# Patient Record
Sex: Female | Born: 1940 | Race: White | Hispanic: No | Marital: Single | Smoking: Former smoker
Health system: Southern US, Community
[De-identification: ages and names within clinical notes are randomized; demographics above are authoritative.]

## PROBLEM LIST (undated history)

## (undated) DIAGNOSIS — K5792 Diverticulitis of intestine, part unspecified, without perforation or abscess without bleeding: Secondary | ICD-10-CM

## (undated) DIAGNOSIS — R053 Chronic cough: Secondary | ICD-10-CM

## (undated) DIAGNOSIS — R42 Dizziness and giddiness: Secondary | ICD-10-CM

## (undated) DIAGNOSIS — I10 Essential (primary) hypertension: Secondary | ICD-10-CM

## (undated) DIAGNOSIS — G4700 Insomnia, unspecified: Secondary | ICD-10-CM

## (undated) DIAGNOSIS — R05 Cough: Secondary | ICD-10-CM

## (undated) DIAGNOSIS — F329 Major depressive disorder, single episode, unspecified: Secondary | ICD-10-CM

## (undated) DIAGNOSIS — J101 Influenza due to other identified influenza virus with other respiratory manifestations: Secondary | ICD-10-CM

## (undated) HISTORY — DX: Insomnia, unspecified: G47.00

## (undated) HISTORY — PX: COLONOSCOPY: SHX174

## (undated) HISTORY — PX: EYE SURGERY: SHX253

## (undated) HISTORY — DX: Essential (primary) hypertension: I10

## (undated) HISTORY — DX: Major depressive disorder, single episode, unspecified: F32.9

---

## 2012-03-11 ENCOUNTER — Emergency Department (HOSPITAL_BASED_OUTPATIENT_CLINIC_OR_DEPARTMENT_OTHER)
Admission: EM | Admit: 2012-03-11 | Discharge: 2012-03-11 | Disposition: A | Discharge status: Home / Self Care | Payer: 59 | Attending: Emergency Medicine | Admitting: Emergency Medicine

## 2012-03-11 ENCOUNTER — Emergency Department (HOSPITAL_BASED_OUTPATIENT_CLINIC_OR_DEPARTMENT_OTHER): Payer: 59

## 2012-03-11 ENCOUNTER — Encounter (HOSPITAL_BASED_OUTPATIENT_CLINIC_OR_DEPARTMENT_OTHER): Payer: Self-pay

## 2012-03-11 DIAGNOSIS — R1110 Vomiting, unspecified: Secondary | ICD-10-CM

## 2012-03-11 DIAGNOSIS — R197 Diarrhea, unspecified: Secondary | ICD-10-CM

## 2012-03-11 DIAGNOSIS — R109 Unspecified abdominal pain: Secondary | ICD-10-CM

## 2012-03-11 DIAGNOSIS — Z8719 Personal history of other diseases of the digestive system: Secondary | ICD-10-CM | POA: Insufficient documentation

## 2012-03-11 DIAGNOSIS — R42 Dizziness and giddiness: Secondary | ICD-10-CM | POA: Insufficient documentation

## 2012-03-11 HISTORY — DX: Diverticulitis of intestine, part unspecified, without perforation or abscess without bleeding: K57.92

## 2012-03-11 HISTORY — DX: Dizziness and giddiness: R42

## 2012-03-11 LAB — CBC WITH DIFFERENTIAL/PLATELET
Basophils Absolute: 0 K/uL (ref 0.0–0.1)
Basophils Relative: 0 % (ref 0–1)
Eosinophils Absolute: 0.2 K/uL (ref 0.0–0.7)
Eosinophils Relative: 3 % (ref 0–5)
HCT: 39.6 % (ref 36.0–46.0)
Hemoglobin: 13.9 g/dL (ref 12.0–15.0)
Lymphocytes Relative: 22 % (ref 12–46)
Lymphs Abs: 1.4 K/uL (ref 0.7–4.0)
MCH: 31 pg (ref 26.0–34.0)
MCHC: 35.1 g/dL (ref 30.0–36.0)
MCV: 88.4 fL (ref 78.0–100.0)
Monocytes Absolute: 0.5 K/uL (ref 0.1–1.0)
Monocytes Relative: 9 % (ref 3–12)
Neutro Abs: 4.1 K/uL (ref 1.7–7.7)
Neutrophils Relative %: 66 % (ref 43–77)
Platelets: 251 K/uL (ref 150–400)
RBC: 4.48 MIL/uL (ref 3.87–5.11)
RDW: 12.4 % (ref 11.5–15.5)
WBC: 6.2 K/uL (ref 4.0–10.5)

## 2012-03-11 LAB — URINALYSIS, ROUTINE W REFLEX MICROSCOPIC
Bilirubin Urine: NEGATIVE
Ketones, ur: NEGATIVE mg/dL
Leukocytes, UA: NEGATIVE
Nitrite: NEGATIVE
Specific Gravity, Urine: 1.016 (ref 1.005–1.030)
Urobilinogen, UA: 0.2 mg/dL (ref 0.0–1.0)

## 2012-03-11 LAB — COMPREHENSIVE METABOLIC PANEL
Albumin: 3.8 g/dL (ref 3.5–5.2)
Alkaline Phosphatase: 71 U/L (ref 39–117)
BUN: 12 mg/dL (ref 6–23)
Creatinine, Ser: 0.8 mg/dL (ref 0.50–1.10)
GFR calc Af Amer: 85 mL/min — ABNORMAL LOW (ref >90)
Glucose, Bld: 103 mg/dL — ABNORMAL HIGH (ref 70–99)
Potassium: 4.1 mEq/L (ref 3.5–5.1)
Total Bilirubin: 0.4 mg/dL (ref 0.3–1.2)
Total Protein: 7.2 g/dL (ref 6.0–8.3)

## 2012-03-11 LAB — LIPASE, BLOOD: Lipase: 27 U/L (ref 11–59)

## 2012-03-11 MED ORDER — HYDROCODONE-ACETAMINOPHEN 5-325 MG PO TABS: 2.0000 | ORAL_TABLET | ORAL | Status: DC | PRN

## 2012-03-11 MED ORDER — IOHEXOL 300 MG/ML  SOLN: 25.0000 mL | INTRAMUSCULAR | Status: AC

## 2012-03-11 MED ORDER — MORPHINE SULFATE 4 MG/ML IJ SOLN
4.0000 mg | Freq: Once | INTRAMUSCULAR | Status: AC
  Administered 2012-03-11: 4 mg via INTRAVENOUS
  Filled 2012-03-11: qty 1

## 2012-03-11 MED ORDER — ONDANSETRON 4 MG PO TBDP: 4.0000 mg | ORAL_TABLET | Freq: Three times a day (TID) | ORAL | Status: DC | PRN

## 2012-03-11 MED ORDER — ONDANSETRON HCL 4 MG/2ML IJ SOLN
4.0000 mg | Freq: Once | INTRAMUSCULAR | Status: AC
  Administered 2012-03-11: 4 mg via INTRAVENOUS
  Filled 2012-03-11: qty 2

## 2012-03-11 MED ORDER — IOHEXOL 300 MG/ML  SOLN
100.0000 mL | Freq: Once | INTRAMUSCULAR | Status: AC | PRN
  Administered 2012-03-11: 100 mL via INTRAVENOUS

## 2012-03-11 MED ORDER — SODIUM CHLORIDE 0.9 % IV SOLN
Freq: Once | INTRAVENOUS | Status: AC
  Administered 2012-03-11: 13:00:00 via INTRAVENOUS

## 2012-03-11 NOTE — ED Provider Notes (Signed)
History     CSN: [id number]  Arrival date & time 03/11/12  1213   First MD Initiated Contact with Patient 03/11/12 1226      Chief Complaint  Patient presents with  . Abdominal Pain    (Consider location/radiation/quality/duration/timing/severity/associated sxs/prior treatment) HPI Comments: Pt states that it feels the same as previous diverticulitis bouts  Patient is a 71 y.o. female presenting with abdominal pain. The history is provided by the patient. No language interpreter was used.  Abdominal Pain The primary symptoms of the illness include abdominal pain, nausea and diarrhea. The primary symptoms of the illness do not include fever, vomiting, dysuria, vaginal discharge or vaginal bleeding. The current episode started more than 2 days ago. The onset of the illness was gradual. The problem has not changed since onset. Significant associated medical issues include diverticulitis.    Past Medical History  Diagnosis Date  . Vertigo   . Diverticulitis     Past Surgical History  Procedure Date  . Eye surgery     No family history on file.  History  Substance Use Topics  . Smoking status: Never Smoker   . Smokeless tobacco: Not on file  . Alcohol Use: No    OB History    Grav Para Term Preterm Abortions TAB SAB Ect Mult Living                  Review of Systems  Constitutional: Negative for fever.  Respiratory: Negative.   Cardiovascular: Negative.   Gastrointestinal: Positive for nausea, abdominal pain and diarrhea. Negative for vomiting.  Genitourinary: Negative for dysuria, vaginal bleeding and vaginal discharge.    Allergies  Review of patient's allergies indicates no known allergies.  Home Medications   Current Outpatient Rx  Name  Route  Sig  Dispense  Refill  . CELEXA PO   Oral   Take by mouth.         . MECLIZINE HCL PO   Oral   Take by mouth.         . TRAZODONE HCL PO   Oral   Take by mouth.           BP 154/78  Pulse 72   Temp 97.3 F (36.3 C) (Oral)  Resp 16  Ht 5\' 4"  (1.626 m)  Wt 167 lb (75.751 kg)  BMI 28.67 kg/m2  SpO2 94%  Physical Exam  Nursing note and vitals reviewed. Constitutional: She is oriented to person, place, and time. She appears well-developed and well-nourished.  HENT:  Head: Normocephalic and atraumatic.  Eyes: Conjunctivae normal are normal. Pupils are equal, round, and reactive to light.  Neck: Neck supple.  Cardiovascular: Normal rate and regular rhythm.   Pulmonary/Chest: Effort normal and breath sounds normal.  Abdominal: Soft. Bowel sounds are normal.       Generalized lower abdominal tenderness  Musculoskeletal: Normal range of motion.  Neurological: She is alert and oriented to person, place, and time.  Skin: Skin is warm and dry.  Psychiatric: She has a normal mood and affect.    ED Course  Procedures (including critical care time)  Labs Reviewed  COMPREHENSIVE METABOLIC PANEL - Abnormal; Notable for the following:    Glucose, Bld 103 (*)     GFR calc non Af Amer 74 (*)     GFR calc Af Amer 85 (*)     All other components within normal limits  CBC WITH DIFFERENTIAL  LIPASE, BLOOD  URINALYSIS, ROUTINE W REFLEX MICROSCOPIC  Ct Abdomen Pelvis W Contrast  03/11/2012  *RADIOLOGY REPORT*  Clinical Data: Abdominal pain, nausea/vomiting, diarrhea  CT ABDOMEN AND PELVIS WITH CONTRAST  Technique:  Multidetector CT imaging of the abdomen and pelvis was performed following the standard protocol during bolus administration of intravenous contrast.  Contrast: OMNIPAQUE IOHEXOL 300 MG/ML  SOLN  Comparison: None.  Findings: Lung bases are essentially clear.  Liver, spleen, and adrenal glands are within normal limits.  Pancreas is notable for suspected pancreas to be some but is otherwise unremarkable.  Gallbladder is unremarkable.  No intrahepatic or extrahepatic ductal dilatation.  Kidneys are within normal limits.  No hydronephrosis.  No evidence of bowel obstruction.   Normal appendix.  Extensive colonic diverticulosis, without associated inflammatory changes.  Atherosclerotic calcifications of the abdominal aorta and branch vessels.  No abdominopelvic ascites.  No suspicious abdominopelvic lymphadenopathy.  Uterus and bilateral ovaries are unremarkable.  Bladder is within normal limits.  Degenerative changes of the visualized thoracolumbar spine.  IMPRESSION: No evidence of bowel obstruction.  Normal appendix.  Extensive colonic diverticulosis, without associated inflammatory changes.  No CT findings to account for the patient's abdominal pain.   Original Report Authenticated By: Charline Bills, M.D.      1. Abdominal pain   2. Vomiting   3. Diarrhea       MDM  Pt tolerating po:no sign of diverticulitis on ZO:[id number] likely viral:will send home with something for nausea and vicodin per pt request        Teressa Lower, NP 03/11/12 1438

## 2012-03-11 NOTE — ED Notes (Signed)
Pt without V/D while in ED

## 2012-03-11 NOTE — ED Notes (Signed)
C/o abd pain x 1 week-n/v/d started yesterday

## 2012-03-11 NOTE — ED Provider Notes (Signed)
Medical screening examination/treatment/procedure(s) were performed by non-physician practitioner and as supervising physician I was immediately available for consultation/collaboration.   Rolan Bucco, MD 03/11/12 1450

## 2016-02-02 ENCOUNTER — Ambulatory Visit (INDEPENDENT_AMBULATORY_CARE_PROVIDER_SITE_OTHER): Payer: Medicare HMO | Admitting: Family Medicine

## 2016-02-02 ENCOUNTER — Encounter (INDEPENDENT_AMBULATORY_CARE_PROVIDER_SITE_OTHER): Payer: Self-pay | Admitting: Family

## 2016-02-02 VITALS — BP 164/74 | HR 79 | Temp 97.9°F | Resp 18

## 2016-02-02 DIAGNOSIS — J209 Acute bronchitis, unspecified: Secondary | ICD-10-CM

## 2016-02-02 MED ORDER — FLUTICASONE PROPIONATE HFA 220 MCG/ACT IN AERO
2.0000 | INHALATION_SPRAY | Freq: Two times a day (BID) | RESPIRATORY_TRACT | 0 refills | Status: AC
Start: 2016-02-02 — End: ?

## 2016-02-02 MED ORDER — ALBUTEROL SULFATE HFA 108 (90 BASE) MCG/ACT IN AERS
2.0000 | INHALATION_SPRAY | Freq: Four times a day (QID) | RESPIRATORY_TRACT | 0 refills | Status: AC | PRN
Start: 2016-02-02 — End: ?

## 2016-02-02 MED ORDER — DOXYCYCLINE MONOHYDRATE 100 MG OR CAPS
100.0000 mg | ORAL_CAPSULE | Freq: Two times a day (BID) | ORAL | 0 refills | Status: AC
Start: 2016-02-02 — End: 2016-02-12

## 2016-02-02 NOTE — Progress Notes (Signed)
[facility]        SUBJECTIVE:  Margaret Bruce is an 75 year old female who presents with   Chief Complaint   Patient presents with   . Cough     wheezing, tired X 1 week       Onset:  1 week  Symptom/s:  Started with cold symptoms and feeling tired. Then she gradually developed worsening cough that is productive. She reports occasionally feeling short of breath and wheezy, but no fever or chills noted.  No chest pain, chest tightness note   no sick contacts.  Treatment tried: none        Review of Systems   Constitutional: Negative for chills and fever.   HENT: Negative for postnasal drip and sinus pain.    Cardiovascular: Negative for palpitations and leg swelling.   Gastrointestinal: Negative for nausea and vomiting.   Neurological: Negative for headaches.       I have  personally reviewed the past medical history and social history with the patient.        Review of patient's allergies indicates:  No Known Allergies    OBJECTIVE:  BP 164/74   Pulse 79   Temp 97.9 F (36.6 C) (Temporal)   Resp 18   SpO2 93%     Physical Exam   Constitutional: She appears well-developed and well-nourished. No distress.   HENT:   Head: Normocephalic and atraumatic.   Right Ear: External ear normal.   Left Ear: External ear normal.   Nose: Nose normal.   Mouth/Throat: Oropharynx is clear and moist. No oropharyngeal exudate.   Neck: Normal range of motion. Neck supple.   Cardiovascular: Normal rate, regular rhythm and normal heart sounds.    Pulmonary/Chest: Effort normal. She has no wheezes. She has rales (more of crackles than rales).   Lymphadenopathy:     She has no cervical adenopathy.       Assessment/Plan:    1. Acute bronchitis, unspecified organism  - antibiotic started based on patient's age and noted crackles on examination.  - Albuterol Sulfate HFA 108 (90 Base) MCG/ACT Inhalation Aero Soln; Inhale 2 puffs by mouth every 6 hours as needed for shortness of breath/wheezing.  Dispense: 1  Inhaler; Refill: 0  - Fluticasone Propionate HFA 220 MCG/ACT Inhalation Aerosol; Inhale 2 puffs by mouth 2 times a day.  Dispense: 1 Inhaler; Refill: 0  - Doxycycline Monohydrate 100 MG Oral Cap; Take 1 capsule (100 mg) by mouth 2 times a day for 10 days. Take until gone.  Dispense: 20 capsule; Refill: 0  - she agrees to establish to care with primary care provider here.    Patient expressed understanding of treatment options discussed, including benefits and risks, questions encouraged and all answered.

## 2016-02-02 NOTE — Patient Instructions (Signed)
Bronchitis, Antibiotic Treatment (Adult)    Bronchitis is an infection of the air passages (bronchial tubes) in your lungs. It often occurs when you have a cold. This illness is contagious during the first few days and is spread through the air by coughing and sneezing, or by direct contact (touching the sick person and then touching your own eyes, nose, or mouth).  Symptoms of bronchitis include cough with mucus (phlegm) and low-grade fever. Bronchitis usually lasts 7 to 14 days. Mild cases can be treated with simple home remedies. More severe infection is treated with an antibiotic.  Home care  Follow these guidelines when caring for yourself at home:   If your symptoms are severe, rest at home for the first 2 to 3 days. When you go back to your usual activities, don't let yourself get too tired.   Do not smoke. Also avoid being exposed to secondhand smoke.   You may use over-the-counter medicines to control fever or pain, unless another medicine was prescribed. If you have chronic liver or kidney disease or have ever had a stomach ulcer or gastrointestinal bleeding, talk with your healthcare provider before using these medicines. Also talk to your provider if you are taking medicine to prevent blood clots. Aspirin should never be given to anyone younger than 73 years of age who is ill with a viral infection or fever. It may cause severe liver or brain damage.   Your appetite may be poor, so a light diet is fine. Avoid dehydration by drinking 6 to 8 glasses of fluids per day (such as water, soft drinks, sports drinks, juices, tea, or soup). Extra fluids will help loosen secretions in the nose and lungs.   Over-the-counter cough, cold, and sore-throat medicines will not shorten the length of the illness, but they may be helpful to reduce symptoms. (Note: Do not use decongestants if you have high blood pressure.)   Finish all antibiotic medicine. Do this even if you are feeling better after only a few days.   Follow-up care  Follow up with your healthcare provider, or as advised. If you had an X-ray or ECG (electrocardiogram), a specialist will review it. You will be notified of any new findings that may affect your care.  If you are age 65 or older, or if you have a chronic lung disease or condition that affects your immune system, or you smoke, askyour healthcare provider about getting apneumococcal vaccine and a yearly flu shot (influenza vaccine).  When to seek medical advice  Call your healthcare provider right away if any of these occur:   Fever of 100.4F (38C) or higher, or as directed by your healthcare provider   Coughing up increased amounts of colored sputum   Weakness, drowsiness, headache, facial pain, ear pain, or a stiff neck  Call 911  Call 911 if any of these occur.   Coughing up blood   Worsening weakness, drowsiness, headache, or stiff neck   Trouble breathing, wheezing, or pain with breathing  Date Last Reviewed: 12/26/2013   2000-2017 [employer] [address], [place] [address]. All rights reserved. This information is not intended as a substitute for professional medical care. Always follow your healthcare professional's instructions.

## 2016-04-15 ENCOUNTER — Inpatient Hospital Stay (HOSPITAL_BASED_OUTPATIENT_CLINIC_OR_DEPARTMENT_OTHER)
Admission: EM | Admit: 2016-04-15 | Discharge: 2016-04-19 | DRG: 152 | Disposition: A | Discharge status: Home / Self Care | Payer: Medicare HMO | Attending: Internal Medicine | Admitting: Internal Medicine

## 2016-04-15 ENCOUNTER — Emergency Department (HOSPITAL_BASED_OUTPATIENT_CLINIC_OR_DEPARTMENT_OTHER): Payer: Medicare HMO

## 2016-04-15 ENCOUNTER — Encounter (HOSPITAL_BASED_OUTPATIENT_CLINIC_OR_DEPARTMENT_OTHER): Payer: Self-pay | Admitting: *Deleted

## 2016-04-15 DIAGNOSIS — J111 Influenza due to unidentified influenza virus with other respiratory manifestations: Principal | ICD-10-CM | POA: Diagnosis present

## 2016-04-15 DIAGNOSIS — R197 Diarrhea, unspecified: Secondary | ICD-10-CM

## 2016-04-15 DIAGNOSIS — Z87891 Personal history of nicotine dependence: Secondary | ICD-10-CM

## 2016-04-15 DIAGNOSIS — R112 Nausea with vomiting, unspecified: Secondary | ICD-10-CM

## 2016-04-15 DIAGNOSIS — J441 Chronic obstructive pulmonary disease with (acute) exacerbation: Secondary | ICD-10-CM | POA: Diagnosis not present

## 2016-04-15 DIAGNOSIS — F4310 Post-traumatic stress disorder, unspecified: Secondary | ICD-10-CM | POA: Diagnosis present

## 2016-04-15 DIAGNOSIS — Z79899 Other long term (current) drug therapy: Secondary | ICD-10-CM

## 2016-04-15 DIAGNOSIS — J9601 Acute respiratory failure with hypoxia: Secondary | ICD-10-CM | POA: Diagnosis present

## 2016-04-15 DIAGNOSIS — M6281 Muscle weakness (generalized): Secondary | ICD-10-CM

## 2016-04-15 DIAGNOSIS — R0902 Hypoxemia: Secondary | ICD-10-CM | POA: Diagnosis present

## 2016-04-15 DIAGNOSIS — E876 Hypokalemia: Secondary | ICD-10-CM | POA: Diagnosis present

## 2016-04-15 LAB — COMPREHENSIVE METABOLIC PANEL
ALBUMIN: 3.9 g/dL (ref 3.5–5.0)
ALT: 25 U/L (ref 14–54)
AST: 31 U/L (ref 15–41)
Alkaline Phosphatase: 59 U/L (ref 38–126)
Anion gap: 11 (ref 5–15)
BILIRUBIN TOTAL: 0.5 mg/dL (ref 0.3–1.2)
BUN: 10 mg/dL (ref 6–20)
CO2: 23 mmol/L (ref 22–32)
Calcium: 8.9 mg/dL (ref 8.9–10.3)
Chloride: 99 mmol/L — ABNORMAL LOW (ref 101–111)
Creatinine, Ser: 0.73 mg/dL (ref 0.44–1.00)
GFR calc Af Amer: >60 mL/min (ref >60)
GFR calc non Af Amer: >60 mL/min (ref >60)
GLUCOSE: 93 mg/dL (ref 65–99)
POTASSIUM: 3.4 mmol/L — AB (ref 3.5–5.1)
Sodium: 133 mmol/L — ABNORMAL LOW (ref 135–145)
TOTAL PROTEIN: 7 g/dL (ref 6.5–8.1)

## 2016-04-15 LAB — URINALYSIS, MICROSCOPIC (REFLEX)

## 2016-04-15 LAB — CBC WITH DIFFERENTIAL/PLATELET
BASOS PCT: 0 %
Basophils Absolute: 0 10*3/uL (ref 0.0–0.1)
Eosinophils Absolute: 0 10*3/uL (ref 0.0–0.7)
Eosinophils Relative: 0 %
HEMATOCRIT: 39.8 % (ref 36.0–46.0)
Hemoglobin: 13.2 g/dL (ref 12.0–15.0)
Lymphocytes Relative: 30 %
Lymphs Abs: 2 10*3/uL (ref 0.7–4.0)
MCH: 30.3 pg (ref 26.0–34.0)
MCHC: 33.2 g/dL (ref 30.0–36.0)
MCV: 91.3 fL (ref 78.0–100.0)
Monocytes Absolute: 0.8 10*3/uL (ref 0.1–1.0)
Monocytes Relative: 12 %
NEUTROS ABS: 3.7 10*3/uL (ref 1.7–7.7)
NEUTROS PCT: 58 %
Platelets: 178 10*3/uL (ref 150–400)
RBC: 4.36 MIL/uL (ref 3.87–5.11)
RDW: 12.7 % (ref 11.5–15.5)
WBC: 6.5 10*3/uL (ref 4.0–10.5)

## 2016-04-15 LAB — URINALYSIS, ROUTINE W REFLEX MICROSCOPIC
Bilirubin Urine: NEGATIVE
GLUCOSE, UA: NEGATIVE mg/dL
KETONES UR: 15 mg/dL — AB
Nitrite: NEGATIVE
PH: 6 (ref 5.0–8.0)
Protein, ur: NEGATIVE mg/dL
SPECIFIC GRAVITY, URINE: 1.018 (ref 1.005–1.030)

## 2016-04-15 LAB — TROPONIN I: Troponin I: <0.03 ng/mL (ref <0.03)

## 2016-04-15 LAB — LIPASE, BLOOD: Lipase: 24 U/L (ref 11–51)

## 2016-04-15 MED ORDER — SODIUM CHLORIDE 0.9 % IV BOLUS (SEPSIS)
1000.0000 mL | Freq: Once | INTRAVENOUS | Status: AC
  Administered 2016-04-15: 1000 mL via INTRAVENOUS

## 2016-04-15 MED ORDER — PREDNISONE 50 MG PO TABS
60.0000 mg | ORAL_TABLET | Freq: Once | ORAL | Status: AC
  Administered 2016-04-15: 60 mg via ORAL
  Filled 2016-04-15: qty 1

## 2016-04-15 MED ORDER — ONDANSETRON HCL 4 MG/2ML IJ SOLN
4.0000 mg | Freq: Once | INTRAMUSCULAR | Status: AC
  Administered 2016-04-15: 4 mg via INTRAVENOUS
  Filled 2016-04-15: qty 2

## 2016-04-15 MED ORDER — IBUPROFEN 400 MG PO TABS
600.0000 mg | ORAL_TABLET | Freq: Once | ORAL | Status: AC
  Administered 2016-04-15: 600 mg via ORAL
  Filled 2016-04-15: qty 1

## 2016-04-15 MED ORDER — IPRATROPIUM-ALBUTEROL 0.5-2.5 (3) MG/3ML IN SOLN
3.0000 mL | Freq: Four times a day (QID) | RESPIRATORY_TRACT | Status: DC
  Administered 2016-04-15: 3 mL via RESPIRATORY_TRACT
  Filled 2016-04-15 (×2): qty 3

## 2016-04-15 MED ORDER — IPRATROPIUM-ALBUTEROL 0.5-2.5 (3) MG/3ML IN SOLN
3.0000 mL | RESPIRATORY_TRACT | Status: AC
  Administered 2016-04-15 (×2): 3 mL via RESPIRATORY_TRACT
  Filled 2016-04-15 (×2): qty 3

## 2016-04-15 MED ORDER — DOXYCYCLINE HYCLATE 100 MG PO TABS
100.0000 mg | ORAL_TABLET | Freq: Once | ORAL | Status: AC
  Administered 2016-04-15: 100 mg via ORAL
  Filled 2016-04-15: qty 1

## 2016-04-15 NOTE — ED Notes (Signed)
Chronic Bronchitis 2 months.  Pt had worsening in her condition 3 days ago with strong wet sounding cough, unable to produce phlegm. Pt also reports body aches, Pt also has a upset stomach (from the phlegm?) and has had post tussive emesis.  Pt states that she has a headache and "every bone" in her body hurts.  Pt also has seen a MD in [place] and has been taking inhalers without any relief.  Pt is having strong coughing spells and states that she is feeling sick and miserable.

## 2016-04-15 NOTE — ED Triage Notes (Signed)
Pt c/o n/v/d  And cough x 2 days

## 2016-04-15 NOTE — Progress Notes (Signed)
Transfer [facility]. Discussed with Dr. Verdie MosherLiu. Ms. Nichole Fiscalnman is a 76 yo female with pmh of COPD; who presents with 3 days of increased cough and SOB. CXR clear. Lab work relatively unremarkable except for potassium 3.4. Suspected to have COPD exacerbation. Given prednisone 60mg  po, 2 DuoNebs, antiemetics, 1  L NS IVF, and doxycycline. Transferring [facility] under observation status.

## 2016-04-15 NOTE — ED Notes (Signed)
RN starting a line, so delay in EKG.

## 2016-04-15 NOTE — ED Notes (Signed)
Pt ambulated to restroom on room air. HR 108, SATS 82%. Placed pt on 2LT [place] O2 tank in restroom ,SATS returned to 94%. Returned pt to room.

## 2016-04-15 NOTE — ED Provider Notes (Signed)
[facility] Provider Note   CSN: [id number] Arrival date & time: 04/15/16  1629 By signing my name below, I, Levon Hedger, attest that this documentation has been prepared under the direction and in the presence of Lavera Guise, MD . Electronically Signed: Levon Hedger, Scribe. 04/15/2016. 9:47 PM   History   Chief Complaint Chief Complaint  Patient presents with  . Emesis  . Cough   HPI Nichole Hunt is a 76 y.o. female with a reported hx of COPD who presents to the Emergency Department complaining of acute on chronic persistent, nonproductive cough which began three days ago.  She reports increased cough x 3 days. She notes associated generalized body aches, chills, generalized abdominal pain, post tussive emesis, diarrhea, chest discomfort, SOB, dizziness, and wheezing. She describes her chest pain as throbbing and states it is exacerbated by vomiting, coughing and moving. Pt was placed on abx and steroids in 10/17 for chronic bronchitis flare. Pt states she had improved at the end of last month, but her condition worsened three days ago. She moved back from [place] on 03/17/16 and denies any leg pain or swelling since this time. She reports decreased PO intake. No hx of heart problems. Pt denies any dysuria or urinary frequency.   The history is provided by the patient. No language interpreter was used.   Past Medical History:  Diagnosis Date  . Diverticulitis   . Vertigo     Patient Active Problem List   Diagnosis Date Noted  . COPD exacerbation (HCC) 04/15/2016    Past Surgical History:  Procedure Laterality Date  . EYE SURGERY      OB History    No data available      Home Medications    Prior to Admission medications   Medication Sig Start Date End Date Taking? Authorizing Provider  albuterol (PROVENTIL HFA;VENTOLIN HFA) 108 (90 Base) MCG/ACT inhaler Inhale into the lungs every 6 (six) hours as needed for wheezing or shortness of breath.   Yes  Historical Provider, MD  Citalopram Hydrobromide (CELEXA PO) Take by mouth.    Historical Provider, MD  HYDROcodone-acetaminophen (NORCO/VICODIN) 5-325 MG per tablet Take 2 tablets by mouth every 4 (four) hours as needed for pain. 03/11/12   Teressa Lower, NP  MECLIZINE HCL PO Take by mouth.    Historical Provider, MD  ondansetron (ZOFRAN ODT) 4 MG disintegrating tablet Take 1 tablet (4 mg total) by mouth every 8 (eight) hours as needed for nausea. 03/11/12   Teressa Lower, NP  TRAZODONE HCL PO Take by mouth.    Historical Provider, MD    Family History History reviewed. No pertinent family history.  Social History Social History  Substance Use Topics  . Smoking status: Never Smoker  . Smokeless tobacco: Not on file  . Alcohol use No    Allergies   Patient has no known allergies.  Review of Systems Review of Systems 10 systems reviewed and all are negative for acute change except as noted in the HPI.   Physical Exam Updated Vital Signs BP 140/68   Pulse 98   Temp 100.8 F (38.2 C) (Oral)   Resp 22   Ht 5\' 4"  (1.626 m)   Wt 170 lb (77.1 kg)   SpO2 90%   BMI 29.18 kg/m   Physical Exam Physical Exam  Nursing note and vitals reviewed. Constitutional: non-toxic, and in no acute distress Head: Normocephalic and atraumatic.  Mouth/Throat: Oropharynx is clear and moist.  Neck: Normal range of  motion. Neck supple.  Cardiovascular: Normal rate and regular rhythm.   Pulmonary/Chest: Effort normal scattered expiratory wheezing with Bronchospastic cough. No conversational dyspnea.   Abdominal: Soft. There is no tenderness. There is no rebound and no guarding.  Musculoskeletal: Normal range of motion. No calf tenderness or lower extremity edema Neurological: Alert, no facial droop, fluent speech, moves all extremities symmetrically Skin: Skin is warm and dry.  Psychiatric: Cooperative   ED Treatments / Results  DIAGNOSTIC STUDIES:  Oxygen Saturation is 97% on RA,  normal by my interpretation.    COORDINATION OF CARE:  6:55 PM Discussed treatment plan with pt at bedside and pt agreed to plan.   Labs (all labs ordered are listed, but only abnormal results are displayed) Labs Reviewed  COMPREHENSIVE METABOLIC PANEL - Abnormal; Notable for the following:       Result Value   Sodium 133 (*)    Potassium 3.4 (*)    Chloride 99 (*)    All other components within normal limits  URINALYSIS, ROUTINE W REFLEX MICROSCOPIC - Abnormal; Notable for the following:    Hgb urine dipstick TRACE (*)    Ketones, ur 15 (*)    Leukocytes, UA SMALL (*)    All other components within normal limits  URINALYSIS, MICROSCOPIC (REFLEX) - Abnormal; Notable for the following:    Bacteria, UA RARE (*)    Squamous Epithelial / LPF 0-5 (*)    All other components within normal limits  CBC WITH DIFFERENTIAL/PLATELET  LIPASE, BLOOD  TROPONIN I    EKG  EKG Interpretation  Date/Time:  Monday April 15 2016 19:45:03 EST Ventricular Rate:  83 PR Interval:    QRS Duration: 95 QT Interval:  385 QTC Calculation: 453 R Axis:   -5 Text Interpretation:  Sinus rhythm Low voltage, precordial leads Consider anterior infarct no prior EKG  Confirmed by Loistine Eberlin MD, Annabelle Harman ([id number]) on 04/15/2016 8:25:22 PM       Radiology Dg Chest 2 View  Result Date: 04/15/2016 CLINICAL DATA:  Cough, nausea, vomiting and diarrhea for the past 2 days. EXAM: CHEST  2 VIEW COMPARISON:  None. FINDINGS: The heart size and mediastinal contours are within normal limits. Minimal aortic atherosclerosis noted. Both lungs are clear. The visualized skeletal structures are unremarkable. IMPRESSION: No active cardiopulmonary disease.  Aortic atherosclerosis. Electronically Signed   By: Tollie Eth M.D.   On: 04/15/2016 17:07    Procedures Procedures (including critical care time)  Medications Ordered in ED Medications  ipratropium-albuterol (DUONEB) 0.5-2.5 (3) MG/3ML nebulizer solution 3 mL (3 mLs Nebulization  Given 04/15/16 1903)  ibuprofen (ADVIL,MOTRIN) tablet 600 mg (600 mg Oral Given 04/15/16 1901)  predniSONE (DELTASONE) tablet 60 mg (60 mg Oral Given 04/15/16 1900)  ondansetron (ZOFRAN) injection 4 mg (4 mg Intravenous Given 04/15/16 1934)  sodium chloride 0.9 % bolus 1,000 mL (0 mLs Intravenous Stopped 04/15/16 2200)  ipratropium-albuterol (DUONEB) 0.5-2.5 (3) MG/3ML nebulizer solution 3 mL (3 mLs Nebulization Given 04/15/16 2121)  doxycycline (VIBRA-TABS) tablet 100 mg (100 mg Oral Given 04/15/16 2238)     Initial Impression / Assessment and Plan / ED Course  I have reviewed the triage vital signs and the nursing notes.  Pertinent labs & imaging results that were available during my care of the patient were reviewed by me and considered in my medical decision making (see chart for details).  Clinical Course    Presenting with cough, dyspnea, n/v/d. Presentation seem consistent with Acute COPD exacerbation in the setting of likely viral  illness. She does have fever to 100.8 Fahrenheit here, but otherwise hemodynamically stable. On room air with normal oxygenation and normal work of breathing. Does have bronchospastic cough with some scattered expiratory wheezes. Appears euvolemic, and no CHF history to suggest acute CHF. Although recent travel one month ago, at this time not concerned for PE. Chest x-ray visualized and shows no evidence of infiltrate or edema or other acute cardiopulmonary processes. Basic blood work reassuring with mild left-sided abnormalities likely due to dehydration. Receive antibiotics and IV fluids and able to tolerate by mouth intake. For COPD given 3 breathing treatments, steroids, and doxycycline. Symptomatically feels improved at rest, but with ambulation she appears very winded and has hypoxia to 82% just walking next door to the bathroom. Discussed with Dr. Katrinka BlazingSmith from hospitalist service who will admit for ongoing management of COPD.   Final Clinical Impressions(s) / ED Diagnoses    Final diagnoses:  Chronic obstructive pulmonary disease with acute exacerbation (HCC)  Nausea vomiting and diarrhea    New Prescriptions New Prescriptions   No medications on file   I personally performed the services described in this documentation, which was scribed in my presence. The recorded information has been reviewed and is accurate.    Lavera Guiseana Duo Aarion Kittrell, MD 04/16/16 [phone]

## 2016-04-15 NOTE — ED Notes (Signed)
Pt placed on O2@2L  due to sats at 86%.

## 2016-04-16 DIAGNOSIS — R0902 Hypoxemia: Secondary | ICD-10-CM

## 2016-04-16 DIAGNOSIS — E876 Hypokalemia: Secondary | ICD-10-CM | POA: Diagnosis present

## 2016-04-16 DIAGNOSIS — F4310 Post-traumatic stress disorder, unspecified: Secondary | ICD-10-CM

## 2016-04-16 DIAGNOSIS — J441 Chronic obstructive pulmonary disease with (acute) exacerbation: Secondary | ICD-10-CM | POA: Diagnosis not present

## 2016-04-16 LAB — CBC
HCT: 39.8 % (ref 36.0–46.0)
Hemoglobin: 13.1 g/dL (ref 12.0–15.0)
MCH: 30.3 pg (ref 26.0–34.0)
MCHC: 32.9 g/dL (ref 30.0–36.0)
MCV: 92.1 fL (ref 78.0–100.0)
PLATELETS: 167 10*3/uL (ref 150–400)
RBC: 4.32 MIL/uL (ref 3.87–5.11)
RDW: 13.2 % (ref 11.5–15.5)
WBC: 4.3 10*3/uL (ref 4.0–10.5)

## 2016-04-16 LAB — BASIC METABOLIC PANEL
Anion gap: 5 (ref 5–15)
BUN: 6 mg/dL (ref 6–20)
CALCIUM: 8.6 mg/dL — AB (ref 8.9–10.3)
CO2: 26 mmol/L (ref 22–32)
Chloride: 105 mmol/L (ref 101–111)
Creatinine, Ser: 0.83 mg/dL (ref 0.44–1.00)
GFR calc Af Amer: >60 mL/min (ref >60)
GLUCOSE: 129 mg/dL — AB (ref 65–99)
Potassium: 4.1 mmol/L (ref 3.5–5.1)
SODIUM: 136 mmol/L (ref 135–145)

## 2016-04-16 LAB — RESPIRATORY PANEL BY PCR
Adenovirus: NOT DETECTED
BORDETELLA PERTUSSIS-RVPCR: NOT DETECTED
CHLAMYDOPHILA PNEUMONIAE-RVPPCR: NOT DETECTED
CORONAVIRUS 229E-RVPPCR: NOT DETECTED
CORONAVIRUS HKU1-RVPPCR: NOT DETECTED
Coronavirus NL63: NOT DETECTED
Coronavirus OC43: NOT DETECTED
Influenza A: NOT DETECTED
Influenza B: DETECTED — AB
MYCOPLASMA PNEUMONIAE-RVPPCR: NOT DETECTED
Metapneumovirus: NOT DETECTED
Parainfluenza Virus 1: NOT DETECTED
Parainfluenza Virus 2: NOT DETECTED
Parainfluenza Virus 3: NOT DETECTED
Parainfluenza Virus 4: NOT DETECTED
Respiratory Syncytial Virus: NOT DETECTED
Rhinovirus / Enterovirus: NOT DETECTED

## 2016-04-16 MED ORDER — HYDROCOD POLST-CPM POLST ER 10-8 MG/5ML PO SUER
5.0000 mL | Freq: Once | ORAL | Status: AC
  Administered 2016-04-16: 5 mL via ORAL
  Filled 2016-04-16: qty 5

## 2016-04-16 MED ORDER — IPRATROPIUM-ALBUTEROL 0.5-2.5 (3) MG/3ML IN SOLN
3.0000 mL | Freq: Three times a day (TID) | RESPIRATORY_TRACT | Status: DC
  Administered 2016-04-16 – 2016-04-19 (×10): 3 mL via RESPIRATORY_TRACT
  Filled 2016-04-16 (×9): qty 3

## 2016-04-16 MED ORDER — ONDANSETRON HCL 4 MG/2ML IJ SOLN
4.0000 mg | Freq: Four times a day (QID) | INTRAMUSCULAR | Status: DC | PRN
  Administered 2016-04-17: 4 mg via INTRAVENOUS
  Filled 2016-04-16: qty 2

## 2016-04-16 MED ORDER — CALCIUM CARBONATE ANTACID 500 MG PO CHEW
1.0000 | CHEWABLE_TABLET | Freq: Three times a day (TID) | ORAL | Status: DC
  Administered 2016-04-16 – 2016-04-19 (×9): 200 mg via ORAL
  Filled 2016-04-16 (×10): qty 1

## 2016-04-16 MED ORDER — GUAIFENESIN-DM 100-10 MG/5ML PO SYRP
5.0000 mL | ORAL_SOLUTION | ORAL | Status: DC | PRN
  Administered 2016-04-16 – 2016-04-17 (×2): 5 mL via ORAL
  Filled 2016-04-16 (×2): qty 5

## 2016-04-16 MED ORDER — CITALOPRAM HYDROBROMIDE 20 MG PO TABS
20.0000 mg | ORAL_TABLET | Freq: Every day | ORAL | Status: DC
  Administered 2016-04-16 – 2016-04-19 (×4): 20 mg via ORAL
  Filled 2016-04-16 (×4): qty 1

## 2016-04-16 MED ORDER — DOXYCYCLINE HYCLATE 100 MG PO TABS
100.0000 mg | ORAL_TABLET | Freq: Every evening | ORAL | Status: DC
  Administered 2016-04-16 – 2016-04-18 (×3): 100 mg via ORAL
  Filled 2016-04-16 (×3): qty 1

## 2016-04-16 MED ORDER — PRAZOSIN HCL 2 MG PO CAPS
2.0000 mg | ORAL_CAPSULE | Freq: Every day | ORAL | Status: DC
  Administered 2016-04-16 – 2016-04-18 (×4): 2 mg via ORAL
  Filled 2016-04-16 (×5): qty 1

## 2016-04-16 MED ORDER — ONDANSETRON HCL 4 MG PO TABS
4.0000 mg | ORAL_TABLET | Freq: Four times a day (QID) | ORAL | Status: DC | PRN
  Administered 2016-04-16: 4 mg via ORAL
  Filled 2016-04-16: qty 1

## 2016-04-16 MED ORDER — OSELTAMIVIR PHOSPHATE 75 MG PO CAPS
75.0000 mg | ORAL_CAPSULE | Freq: Two times a day (BID) | ORAL | Status: DC
Start: 2016-04-16 — End: 2016-04-19
  Administered 2016-04-16 – 2016-04-19 (×6): 75 mg via ORAL
  Filled 2016-04-16 (×8): qty 1

## 2016-04-16 MED ORDER — ACETAMINOPHEN 325 MG PO TABS
650.0000 mg | ORAL_TABLET | Freq: Four times a day (QID) | ORAL | Status: DC | PRN
  Administered 2016-04-16 – 2016-04-19 (×3): 650 mg via ORAL
  Filled 2016-04-16 (×3): qty 2

## 2016-04-16 MED ORDER — PREDNISONE 50 MG PO TABS
60.0000 mg | ORAL_TABLET | Freq: Once | ORAL | Status: AC
  Administered 2016-04-16: 60 mg via ORAL
  Filled 2016-04-16: qty 1

## 2016-04-16 MED ORDER — POTASSIUM CHLORIDE CRYS ER 20 MEQ PO TBCR
20.0000 meq | EXTENDED_RELEASE_TABLET | ORAL | Status: AC
  Administered 2016-04-16: 20 meq via ORAL
  Filled 2016-04-16: qty 1

## 2016-04-16 MED ORDER — ALPRAZOLAM 0.25 MG PO TABS
0.2500 mg | ORAL_TABLET | Freq: Two times a day (BID) | ORAL | Status: DC | PRN
  Administered 2016-04-16 – 2016-04-18 (×4): 0.25 mg via ORAL
  Filled 2016-04-16 (×5): qty 1

## 2016-04-16 MED ORDER — ACETAMINOPHEN 650 MG RE SUPP: 650.0000 mg | Freq: Four times a day (QID) | RECTAL | Status: DC | PRN

## 2016-04-16 MED ORDER — IPRATROPIUM-ALBUTEROL 0.5-2.5 (3) MG/3ML IN SOLN: 3.0000 mL | RESPIRATORY_TRACT | Status: DC | PRN

## 2016-04-16 MED ORDER — BUDESONIDE 0.5 MG/2ML IN SUSP
0.5000 mg | Freq: Two times a day (BID) | RESPIRATORY_TRACT | Status: DC
  Administered 2016-04-16 – 2016-04-19 (×7): 0.5 mg via RESPIRATORY_TRACT
  Filled 2016-04-16 (×7): qty 2

## 2016-04-16 MED ORDER — IPRATROPIUM-ALBUTEROL 0.5-2.5 (3) MG/3ML IN SOLN: 3.0000 mL | Freq: Four times a day (QID) | RESPIRATORY_TRACT | Status: DC | PRN

## 2016-04-16 MED ORDER — ALUM & MAG HYDROXIDE-SIMETH 200-200-20 MG/5ML PO SUSP
30.0000 mL | Freq: Four times a day (QID) | ORAL | Status: DC | PRN
  Administered 2016-04-16 – 2016-04-17 (×2): 30 mL via ORAL
  Filled 2016-04-16 (×2): qty 30

## 2016-04-16 MED ORDER — ENOXAPARIN SODIUM 40 MG/0.4ML ~~LOC~~ SOLN
40.0000 mg | SUBCUTANEOUS | Status: DC
  Administered 2016-04-16 – 2016-04-19 (×4): 40 mg via SUBCUTANEOUS
  Filled 2016-04-16 (×4): qty 0.4

## 2016-04-16 MED ORDER — TRAZODONE HCL 50 MG PO TABS
50.0000 mg | ORAL_TABLET | Freq: Every day | ORAL | Status: DC
  Administered 2016-04-16 – 2016-04-18 (×4): 50 mg via ORAL
  Filled 2016-04-16 (×4): qty 1

## 2016-04-16 NOTE — Progress Notes (Signed)
RT instructed pt on the use of flutter valve. Pt able to demonstrate back good effort. 

## 2016-04-16 NOTE — H&P (Signed)
History and Physical    Nichole Hunt ZOX:[id number] DOB: 1940/09/17 DOA: 04/15/2016  Referring MD/NP/PA: Dr. Verdie Mosher PCP: No PCP Per Patient  Patient coming from: [facility]  Chief Complaint: cough  HPI: Nichole Hunt is a 76 y.o. female with medical history significant of  bronchitis; who presents with complaints of a 3 day history of nonproductive cough. She just flew back from [place] on December 3 and has not setup care with a primary care provider yet. Patient notes that symptoms started after she stopped taking her Flovent inhaler.  Associated symptoms included subjective fever, wheezing, chills, muscle aches, headache, nausea, vomiting, diarrhea, dyspnea on exertion, and decreased oral intake. She reports that she feels that she is coughing so much that it causes her to vomit. Denies having any dysuria, loss of consciousness, weight gain, leg swelling, or leg pain.  ED Course: Lab work relatively unremarkable except for potassium 3.4, and troponin  <0.03. O2 saturations dropped to 88% with ambulation. CXR clear. Lab work Given prednisone 60mg  po, 2 DuoNebs, antiemetics, 1  L NS IVF, and doxycycline. Transfer to a MedSurg bed at [facility].  Review of Systems: As per HPI otherwise 10 point review of systems negative.   Past Medical History:  Diagnosis Date  . Diverticulitis   . Vertigo     Past Surgical History:  Procedure Laterality Date  . EYE SURGERY       reports that she has never smoked. She does not have any smokeless tobacco history on file. She reports that she does not drink alcohol or use drugs.  No Known Allergies  History reviewed. No pertinent family history.  Prior to Admission medications   Medication Sig Start Date End Date Taking? Authorizing Provider  albuterol (PROVENTIL HFA;VENTOLIN HFA) 108 (90 Base) MCG/ACT inhaler Inhale into the lungs every 6 (six) hours as needed for wheezing or shortness of breath.   Yes Historical Provider, MD  Citalopram Hydrobromide  (CELEXA PO) Take by mouth.    Historical Provider, MD  HYDROcodone-acetaminophen (NORCO/VICODIN) 5-325 MG per tablet Take 2 tablets by mouth every 4 (four) hours as needed for pain. 03/11/12   Teressa Lower, NP  MECLIZINE HCL PO Take by mouth.    Historical Provider, MD  ondansetron (ZOFRAN ODT) 4 MG disintegrating tablet Take 1 tablet (4 mg total) by mouth every 8 (eight) hours as needed for nausea. 03/11/12   Teressa Lower, NP  TRAZODONE HCL PO Take by mouth.    Historical Provider, MD    Physical Exam:  Constitutional: Elderly female who appears to be in NAD, calm, comfortable Vitals:   04/15/16 2130 04/15/16 2300 04/16/16 0036 04/16/16 0040  BP: 131/64 140/68  130/70  Pulse: 93 98  69  Resp:  22  18  Temp:    97.5 F (36.4 C)  TempSrc:    Oral  SpO2: 98% 90%  97%  Weight:   77.4 kg (170 lb 9.6 oz)   Height:   5' 4.8" (1.646 m)    Eyes: PERRL, lids and conjunctivae normal ENMT: Mucous membranes are moist. Posterior pharynx clear of any exudate or lesions. Neck: normal, supple, no masses, no thyromegaly Respiratory: Decreased overall aeration. Expiratory wheezes appreciated. Patient able to talk and nearly complete sentences. Cardiovascular: Regular rate and rhythm, no murmurs / rubs / gallops. No extremity edema. 2+ pedal pulses. No carotid bruits.  Chest pain reproducible on palpation Abdomen: no tenderness, no masses palpated. No hepatosplenomegaly. Bowel sounds positive.  Musculoskeletal: no clubbing / cyanosis. No joint  deformity upper and lower extremities. Good ROM, no contractures. Normal muscle tone.  Skin: no rashes, lesions, ulcers. No induration Neurologic: CN 2-12 grossly intact. Sensation intact, DTR normal. Strength 5/5 in all 4.  Psychiatric: Normal judgment and insight. Alert and oriented x 3. Normal mood.     Labs on Admission: I have personally reviewed following labs and imaging studies  CBC:  Recent Labs Lab 04/15/16 1934  WBC 6.5  NEUTROABS 3.7   HGB 13.2  HCT 39.8  MCV 91.3  PLT 178   Basic Metabolic Panel:  Recent Labs Lab 04/15/16 1934  NA 133*  K 3.4*  CL 99*  CO2 23  GLUCOSE 93  BUN 10  CREATININE 0.73  CALCIUM 8.9   GFR: Estimated Creatinine Clearance: 64.2 mL/min (by C-G formula based on SCr of 0.73 mg/dL). Liver Function Tests:  Recent Labs Lab 04/15/16 1934  AST 31  ALT 25  ALKPHOS 59  BILITOT 0.5  PROT 7.0  ALBUMIN 3.9    Recent Labs Lab 04/15/16 1934  LIPASE 24   No results for input(s): AMMONIA in the last 168 hours. Coagulation Profile: No results for input(s): INR, PROTIME in the last 168 hours. Cardiac Enzymes:  Recent Labs Lab 04/15/16 1934  TROPONINI <0.03   BNP (last 3 results) No results for input(s): PROBNP in the last 8760 hours. HbA1C: No results for input(s): HGBA1C in the last 72 hours. CBG: No results for input(s): GLUCAP in the last 168 hours. Lipid Profile: No results for input(s): CHOL, HDL, LDLCALC, TRIG, CHOLHDL, LDLDIRECT in the last 72 hours. Thyroid Function Tests: No results for input(s): TSH, T4TOTAL, FREET4, T3FREE, THYROIDAB in the last 72 hours. Anemia Panel: No results for input(s): VITAMINB12, FOLATE, FERRITIN, TIBC, IRON, RETICCTPCT in the last 72 hours. Urine analysis:    Component Value Date/Time   COLORURINE YELLOW 04/15/2016 1934   APPEARANCEUR CLEAR 04/15/2016 1934   LABSPEC 1.018 04/15/2016 1934   PHURINE 6.0 04/15/2016 1934   GLUCOSEU NEGATIVE 04/15/2016 1934   HGBUR TRACE (A) 04/15/2016 1934   BILIRUBINUR NEGATIVE 04/15/2016 1934   KETONESUR 15 (A) 04/15/2016 1934   PROTEINUR NEGATIVE 04/15/2016 1934   UROBILINOGEN 0.2 03/11/2012 1400   NITRITE NEGATIVE 04/15/2016 1934   LEUKOCYTESUR SMALL (A) 04/15/2016 1934   Sepsis Labs: No results found for this or any previous visit (from the past 240 hour(s)).   Radiological Exams on Admission: Dg Chest 2 View  Result Date: 04/15/2016 CLINICAL DATA:  Cough, nausea, vomiting and diarrhea  for the past 2 days. EXAM: CHEST  2 VIEW COMPARISON:  None. FINDINGS: The heart size and mediastinal contours are within normal limits. Minimal aortic atherosclerosis noted. Both lungs are clear. The visualized skeletal structures are unremarkable. IMPRESSION: No active cardiopulmonary disease.  Aortic atherosclerosis. Electronically Signed   By: Tollie Eth M.D.   On: 04/15/2016 17:07    EKG: Independently reviewed. Sinus rhythm  Assessment/Plan COPD exacerbation secondary to suspected viral infection: Acute. Patient presents with complaints of cough, wheezing , shortness of breath, fever, and muscle aches. Suspect COPD exacerbation or bronchitis. Chest x-ray otherwise clear. - Admit to MedSurg. - Check respiratory viral panel - COPD exacerbation protocol initiated - Prednisone 60 mg daily, will need to taper dose - DuoNebs q 6hr and prn SOB/ Wheezing - Doxycycline 100 mg by mouth daily for possibility of atypical pneumonia.  Hypoxia: Acute. Patient O2 sats saturations dropped down into the upper 80s with ambulation. - Continuous pulse oximetry with nasal cannula oxygen and keep  O2 saturation 92% - Physical therapy to ambulate  Hypokalemia: Potassium 3.4 on admission. - Potassium chloride 20 mEq orally 1 dose now - Continue to monitor and replace as needed   PTSD - Continue prazosin and Celexa  DVT prophylaxis:  lovenox  Code Status: Full Family Communication: No family present at bedside Disposition Plan: Likely discharge home once medically stable Consults called: None  Admission status: Observation  Clydie Braunondell A Kyheem Bathgate MD [facility] Pager [phone]- [phone]  If 7PM-7AM, please contact night-coverage [email or web address] Password [email or web address]  04/16/2016, 1:06 AM

## 2016-04-16 NOTE — Evaluation (Signed)
Physical Therapy Evaluation Patient Details Name: Lexis Potenza MRN: [id number] DOB: July 27, 1940 Today's Date: 04/16/2016   History of Present Illness  Patient is a 76 year old female with history of chronic bronchitis, COPD, former smoker, DSD, was admitted for URI type symptoms causing COPD exacerbation with shortness of breath and significant wheezing.  Clinical Impression  Patient presents with decreased mobility due to deficits listed in PT problem list.  She will benefit from skilled PT in the acute setting to allow return home with family support.     Follow Up Recommendations No PT follow up    Equipment Recommendations  None recommended by PT    Recommendations for Other Services       Precautions / Restrictions Precautions Precautions: Fall      Mobility  Bed Mobility Overal bed mobility: Modified Independent                Transfers Overall transfer level: Needs assistance   Transfers: Sit to/from Stand Sit to Stand: Min guard         General transfer comment: steadying assist  Ambulation/Gait Ambulation/Gait assistance: Min assist Ambulation Distance (Feet): 200 Feet Assistive device: None Gait Pattern/deviations: Step-through pattern;Trunk flexed;Decreased stride length;Drifts right/left     General Gait Details: leaning on PT during ambulation, reports not feeling well, mild instability, but no LOB  Stairs            Wheelchair Mobility    Modified Rankin (Stroke Patients Only)       Balance Overall balance assessment: Needs assistance           Standing balance-Leahy Scale: Good Standing balance comment: static balance good, some imbalance with ambulation                             Pertinent Vitals/Pain Pain Assessment: Faces Faces Pain Scale: Hurts little more Pain Location: chest Pain Descriptors / Indicators: Sore Pain Intervention(s): Monitored during session;Repositioned    Home Living  Family/patient expects to be discharged to:: Private residence Living Arrangements: Children Available Help at Discharge: Family Type of Home: House       Home Layout: Two level;Able to live on main level with bedroom/bathroom Home Equipment: Shower seat;Grab bars - tub/shower Additional Comments: lives with different children/grandchildren in [place], [place], [place].  Just here from [place].  Has been staying on basement level, but family plans to move her upstairs due to breathing issues    Prior Function Level of Independence: Independent               Hand Dominance        Extremity/Trunk Assessment   Upper Extremity Assessment Upper Extremity Assessment: Overall WFL for tasks assessed    Lower Extremity Assessment Lower Extremity Assessment: Overall WFL for tasks assessed    Cervical / Trunk Assessment Cervical / Trunk Assessment: Kyphotic  Communication   Communication: No difficulties  Cognition Arousal/Alertness: Awake/alert Behavior During Therapy: WFL for tasks assessed/performed Overall Cognitive Status: Within Functional Limits for tasks assessed                      General Comments General comments (skin integrity, edema, etc.): patient coughing vigorously and vomited during session, RN in to give meds; SpO2 on RA 96% (after O2 removed;) 88-92% with ambulation on RA and pt actively conversing; so c/o SOB or signs of dyspnea    Exercises     Assessment/Plan    PT  Assessment Patient needs continued PT services  PT Problem List Decreased strength;Decreased activity tolerance;Decreased balance;Decreased knowledge of use of DME;Decreased mobility          PT Treatment Interventions DME instruction;Gait training;Stair training;Functional mobility training;Balance training;Therapeutic exercise;Therapeutic activities;Patient/family education    PT Goals (Current goals can be found in the Care Plan section)  Acute Rehab PT Goals Patient Stated Goal: To get  better PT Goal Formulation: With patient/family Time For Goal Achievement: 04/20/16 Potential to Achieve Goals: Good    Frequency Min 3X/week   Barriers to discharge        Co-evaluation               End of Session Equipment Utilized During Treatment: Gait belt Activity Tolerance: Patient limited by fatigue Patient left: in bed;with call bell/phone within reach;with family/visitor present;with nursing/sitter in room      Functional Assessment Tool Used: Clinical Judgement Functional Limitation: Mobility: Walking and moving around Mobility: Walking and Moving Around Current Status (N8295(G8978): At least 1 percent but less than 20 percent impaired, limited or restricted Mobility: Walking and Moving Around Goal Status [phone](G8979): 0 percent impaired, limited or restricted    Time: 1222-1249 PT Time Calculation (min) (ACUTE ONLY): 27 min   Charges:   PT Evaluation $PT Eval Low Complexity: 1 Procedure PT Treatments $Gait Training: 8-22 mins   PT G Codes:   PT G-Codes **NOT FOR INPATIENT CLASS** Functional Assessment Tool Used: Clinical Judgement Functional Limitation: Mobility: Walking and moving around Mobility: Walking and Moving Around Current Status (Q6578(G8978): At least 1 percent but less than 20 percent impaired, limited or restricted Mobility: Walking and Moving Around Goal Status [phone](G8979): 0 percent impaired, limited or restricted    Elray McgregorCynthia Latorsha Curling 04/16/2016, 1:42 PM  Sheran Lawlessyndi Adarian Bur, [place] [phone] 04/16/2016

## 2016-04-16 NOTE — Progress Notes (Signed)
Patient seen and examined this morning, admitted by Dr. Katrinka BlazingSmith overnight. In brief, this is a pleasant 76 year old female with history of chronic bronchitis, COPD, former smoker, DSD, was admitted for URI type symptoms causing COPD exacerbation with shortness of breath and significant wheezing.  COPD exacerbation - Continue scheduled nebulizers with Pulmicort and DuoNeb's, continue steroids of prednisone, doxycycline empirically, check respiratory virus panel  Acute hypoxemic respiratory failure - Provide oxygen as needed, wean off as tolerated  Tobacco abuse, in remission - She is no longer smoking.  PTSD - continue home medications  Costin M. Elvera LennoxGherghe, MD [facility] [phone]

## 2016-04-16 NOTE — Progress Notes (Signed)
SATURATION QUALIFICATIONS: (This note is used to comply with regulatory documentation for home oxygen)  Patient Saturations on Room Air at Rest = 96%  Patient Saturations on Room Air while Ambulating = 91%  Patient Saturations on Liters of oxygen while Ambulating = (not tested)%  Please briefly explain why patient needs home oxygen:  Currently saturating well on room air, no current need for home O2  Nichole Hunt, [place] [phone] 04/16/2016

## 2016-04-17 DIAGNOSIS — Z79899 Other long term (current) drug therapy: Secondary | ICD-10-CM | POA: Diagnosis not present

## 2016-04-17 DIAGNOSIS — J441 Chronic obstructive pulmonary disease with (acute) exacerbation: Secondary | ICD-10-CM | POA: Diagnosis not present

## 2016-04-17 DIAGNOSIS — J111 Influenza due to unidentified influenza virus with other respiratory manifestations: Secondary | ICD-10-CM | POA: Diagnosis not present

## 2016-04-17 DIAGNOSIS — J9601 Acute respiratory failure with hypoxia: Secondary | ICD-10-CM | POA: Diagnosis present

## 2016-04-17 DIAGNOSIS — E876 Hypokalemia: Secondary | ICD-10-CM | POA: Diagnosis present

## 2016-04-17 DIAGNOSIS — F4310 Post-traumatic stress disorder, unspecified: Secondary | ICD-10-CM | POA: Diagnosis present

## 2016-04-17 DIAGNOSIS — J09X2 Influenza due to identified novel influenza A virus with other respiratory manifestations: Secondary | ICD-10-CM

## 2016-04-17 DIAGNOSIS — Z87891 Personal history of nicotine dependence: Secondary | ICD-10-CM | POA: Diagnosis not present

## 2016-04-17 MED ORDER — METHYLPREDNISOLONE SODIUM SUCC 125 MG IJ SOLR
60.0000 mg | Freq: Three times a day (TID) | INTRAMUSCULAR | Status: DC
  Administered 2016-04-17 – 2016-04-18 (×3): 60 mg via INTRAVENOUS
  Filled 2016-04-17 (×3): qty 2

## 2016-04-17 MED ORDER — HYDROCOD POLST-CPM POLST ER 10-8 MG/5ML PO SUER
5.0000 mL | Freq: Two times a day (BID) | ORAL | Status: DC | PRN
  Administered 2016-04-17 – 2016-04-18 (×3): 5 mL via ORAL
  Filled 2016-04-17 (×3): qty 5

## 2016-04-17 MED ORDER — PREDNISONE 20 MG PO TABS
40.0000 mg | ORAL_TABLET | Freq: Once | ORAL | Status: AC
  Administered 2016-04-17: 40 mg via ORAL
  Filled 2016-04-17: qty 2

## 2016-04-17 MED ORDER — GUAIFENESIN ER 600 MG PO TB12
1200.0000 mg | ORAL_TABLET | Freq: Two times a day (BID) | ORAL | Status: DC
  Administered 2016-04-17 – 2016-04-19 (×5): 1200 mg via ORAL
  Filled 2016-04-17 (×5): qty 2

## 2016-04-17 MED ORDER — ENSURE ENLIVE PO LIQD
237.0000 mL | Freq: Two times a day (BID) | ORAL | Status: DC
  Administered 2016-04-17 – 2016-04-19 (×4): 237 mL via ORAL

## 2016-04-17 MED ORDER — BENZONATATE 100 MG PO CAPS
100.0000 mg | ORAL_CAPSULE | Freq: Two times a day (BID) | ORAL | Status: DC | PRN
  Administered 2016-04-17 – 2016-04-18 (×4): 100 mg via ORAL
  Filled 2016-04-17 (×4): qty 1

## 2016-04-17 MED ORDER — PANTOPRAZOLE SODIUM 40 MG PO TBEC
40.0000 mg | DELAYED_RELEASE_TABLET | Freq: Every day | ORAL | Status: DC
  Administered 2016-04-17 – 2016-04-19 (×3): 40 mg via ORAL
  Filled 2016-04-17 (×3): qty 1

## 2016-04-17 MED ORDER — ADULT MULTIVITAMIN W/MINERALS CH
1.0000 | ORAL_TABLET | Freq: Every day | ORAL | Status: DC
  Administered 2016-04-17 – 2016-04-19 (×3): 1 via ORAL
  Filled 2016-04-17 (×3): qty 1

## 2016-04-17 MED ORDER — METHYLPREDNISOLONE SODIUM SUCC 125 MG IJ SOLR
60.0000 mg | Freq: Two times a day (BID) | INTRAMUSCULAR | Status: DC
  Administered 2016-04-17: 60 mg via INTRAVENOUS
  Filled 2016-04-17: qty 2

## 2016-04-17 NOTE — Care Management Obs Status (Signed)
MEDICARE OBSERVATION STATUS NOTIFICATION   Patient Details  Name: Nichole Hunt MRN: [id number] Date of Birth: 31-Dec-1940   Medicare Observation Status Notification Given:  Yes    Lawerance SabalDebbie Kadasia Kassing, RN 04/17/2016, 11:12 AM

## 2016-04-17 NOTE — Progress Notes (Signed)
PROGRESS NOTE                                                                                                                                                                                                             Patient Demographics:    Nichole Hunt, is a 76 y.o. female, DOB - Dec 16, 1940, ZOX:[id number]  Admit date - 04/15/2016   Admitting Physician Clydie Braun, MD  Outpatient Primary MD for the patient is No PCP Per Patient  LOS - 0   Chief Complaint  Patient presents with  . Emesis  . Cough       Brief Narrative   76 year old female with history of chronic bronchitis, COPD, former smoker, DSD, was admitted for URI type symptoms causing COPD exacerbation with shortness of breath and significant wheezing, she is influenza positive as well   Subjective:    Bronson Ing today has, No headache, No chest pain, No abdominal pain - No Nausea, Report generalized weakness and fatigue, complains of cough, productive, and shortness of breath    Assessment  & Plan :    Principal Problem:   COPD exacerbation (HCC) Active Problems:   Hypoxia   Hypokalemia   PTSD (post-traumatic stress disorder)   COPD exacerbation  - secondary to suspected viral infection: Acute. Patient presents with complaints of cough, wheezing , shortness of breath, fever, and muscle aches, . Chest x-ray otherwise clear. - Significant wheezing this a.m., demise hypoxic on ambulation, significant improvement on by mouth prednisone this a.m., will start on IV Solu-Medrol. - Continue with scheduled neb, Pulmicort, and doxycycline  Influenza infection - Continue with Tamiflu  Acute hypoxic respiratory failure - due  to above, hypoxic on ambulation, but he likely will need home oxygen on discharge  Hypokalemia - repleted,   PTSD - Continue prazosin and Celexa  Code Status : Full  Family Communication  : None at bedside  Disposition Plan  : Home when  stable  Consults  :  None  Procedures  : None  DVT Prophylaxis  :  Lovenox   Lab Results  Component Value Date   PLT 167 04/16/2016    Antibiotics  :    Anti-infectives    Start     Dose/Rate Route Frequency Ordered Stop   04/16/16 1800  doxycycline (VIBRA-TABS) tablet 100 mg  100 mg Oral Every evening 04/16/16 0120     04/16/16 1700  oseltamivir (TAMIFLU) capsule 75 mg     75 mg Oral 2 times daily 04/16/16 1532 04/21/16 2159   04/15/16 2245  doxycycline (VIBRA-TABS) tablet 100 mg     100 mg Oral  Once 04/15/16 2230 04/15/16 2238        Objective:   Vitals:   04/16/16 2012 04/16/16 2013 04/16/16 2105 04/17/16 0534  BP:   (!) 158/94 (!) 130/51  Pulse:   82 79  Resp:   19 20  Temp:   98.5 F (36.9 C) 99.1 F (37.3 C)  TempSrc:   Oral Oral  SpO2: 96% 96% 92% 92%  Weight:    82.5 kg (181 lb 14.1 oz)  Height:        Wt Readings from Last 3 Encounters:  04/17/16 82.5 kg (181 lb 14.1 oz)  03/11/12 75.8 kg (167 lb)     Intake/Output Summary (Last 24 hours) at 04/17/16 1427 Last data filed at 04/17/16 0756  Gross per 24 hour  Intake              480 ml  Output                0 ml  Net              480 ml     Physical Exam  Awake Alert, Oriented X 3, No new F.N deficits, Normal affect Supple Neck,No JVD, No cervical lymphadenopathy appriciated.  Symmetrical Chest wall movement, Good air movement bilaterally, Scattered wheezing RRR,No Gallops,Rubs or new Murmurs, No Parasternal Heave +ve B.Sounds, Abd Soft, No tenderness, No rebound - guarding or rigidity. No Cyanosis, Clubbing or edema, No new Rash or bruise      Data Review:    CBC  Recent Labs Lab 04/15/16 1934 04/16/16 0810  WBC 6.5 4.3  HGB 13.2 13.1  HCT 39.8 39.8  PLT 178 167  MCV 91.3 92.1  MCH 30.3 30.3  MCHC 33.2 32.9  RDW 12.7 13.2  LYMPHSABS 2.0  --   MONOABS 0.8  --   EOSABS 0.0  --   BASOSABS 0.0  --     Chemistries   Recent Labs Lab 04/15/16 1934 04/16/16 0810  NA  133* 136  K 3.4* 4.1  CL 99* 105  CO2 23 26  GLUCOSE 93 129*  BUN 10 6  CREATININE 0.73 0.83  CALCIUM 8.9 8.6*  AST 31  --   ALT 25  --   ALKPHOS 59  --   BILITOT 0.5  --    ------------------------------------------------------------------------------------------------------------------ No results for input(s): CHOL, HDL, LDLCALC, TRIG, CHOLHDL, LDLDIRECT in the last 72 hours.  No results found for: HGBA1C ------------------------------------------------------------------------------------------------------------------ No results for input(s): TSH, T4TOTAL, T3FREE, THYROIDAB in the last 72 hours.  Invalid input(s): FREET3 ------------------------------------------------------------------------------------------------------------------ No results for input(s): VITAMINB12, FOLATE, FERRITIN, TIBC, IRON, RETICCTPCT in the last 72 hours.  Coagulation profile No results for input(s): INR, PROTIME in the last 168 hours.  No results for input(s): DDIMER in the last 72 hours.  Cardiac Enzymes  Recent Labs Lab 04/15/16 1934  TROPONINI <0.03   ------------------------------------------------------------------------------------------------------------------ No results found for: BNP  Inpatient Medications  Scheduled Meds: . budesonide (PULMICORT) nebulizer solution  0.5 mg Nebulization BID  . calcium carbonate  1 tablet Oral TID WC  . citalopram  20 mg Oral Daily  . doxycycline  100 mg Oral QPM  . enoxaparin (LOVENOX) injection  40  mg Subcutaneous Q24H  . feeding supplement (ENSURE ENLIVE)  237 mL Oral BID BM  . guaiFENesin  1,200 mg Oral BID  . ipratropium-albuterol  3 mL Nebulization TID  . methylPREDNISolone (SOLU-MEDROL) injection  60 mg Intravenous Q8H  . multivitamin with minerals  1 tablet Oral Daily  . oseltamivir  75 mg Oral BID  . pantoprazole  40 mg Oral Daily  . prazosin  2 mg Oral QHS  . traZODone  50 mg Oral QHS   Continuous Infusions: PRN  Meds:.acetaminophen **OR** acetaminophen, ALPRAZolam, alum & mag hydroxide-simeth, benzonatate, chlorpheniramine-HYDROcodone, guaiFENesin-dextromethorphan, ipratropium-albuterol, ondansetron **OR** ondansetron (ZOFRAN) IV  Micro Results Recent Results (from the past 240 hour(s))  Respiratory Panel by PCR     Status: Abnormal   Collection Time: 04/16/16  2:13 AM  Result Value Ref Range Status   Adenovirus NOT DETECTED NOT DETECTED Final   Coronavirus 229E NOT DETECTED NOT DETECTED Final   Coronavirus HKU1 NOT DETECTED NOT DETECTED Final   Coronavirus NL63 NOT DETECTED NOT DETECTED Final   Coronavirus OC43 NOT DETECTED NOT DETECTED Final   Metapneumovirus NOT DETECTED NOT DETECTED Final   Rhinovirus / Enterovirus NOT DETECTED NOT DETECTED Final   Influenza A NOT DETECTED NOT DETECTED Final   Influenza B DETECTED (A) NOT DETECTED Final   Parainfluenza Virus 1 NOT DETECTED NOT DETECTED Final   Parainfluenza Virus 2 NOT DETECTED NOT DETECTED Final   Parainfluenza Virus 3 NOT DETECTED NOT DETECTED Final   Parainfluenza Virus 4 NOT DETECTED NOT DETECTED Final   Respiratory Syncytial Virus NOT DETECTED NOT DETECTED Final   Bordetella pertussis NOT DETECTED NOT DETECTED Final   Chlamydophila pneumoniae NOT DETECTED NOT DETECTED Final   Mycoplasma pneumoniae NOT DETECTED NOT DETECTED Final    Radiology Reports Dg Chest 2 View  Result Date: 04/15/2016 CLINICAL DATA:  Cough, nausea, vomiting and diarrhea for the past 2 days. EXAM: CHEST  2 VIEW COMPARISON:  None. FINDINGS: The heart size and mediastinal contours are within normal limits. Minimal aortic atherosclerosis noted. Both lungs are clear. The visualized skeletal structures are unremarkable. IMPRESSION: No active cardiopulmonary disease.  Aortic atherosclerosis. Electronically Signed   By: Tollie Ethavid  Kwon M.D.   On: 04/15/2016 17:07       Jameshia Hayashida M.D on 04/17/2016 at 2:27 PM  Between 7am to 7pm - Pager - [phone]  After 7pm  go to [email or web address] - password [email or web address]  [facility] -  Office  [phone]

## 2016-04-17 NOTE — Care Management Note (Addendum)
Case Management Note  Patient Details  Name: Nichole Hunt MRN: [id number] Date of Birth: 1940/08/26  Subjective/Objective:                 Patient from home, lives with son and DIL. DIL stays with patient most of day. Patient continues mucinex, tamiflu, oral steroids. Patient states she is not feeling well today, and has low threshold for exertion. Would like to follow up with [facility]. PCP is in [place], patient in process of transferring records to local MD.     Action/Plan:  No CM needs identified at this time, will continue to follow.  Expected Discharge Date:                  Expected Discharge Plan:  Home/Self Care  In-House Referral:     Discharge planning Services  CM Consult  Post Acute Care Choice:  NA Choice offered to:     DME Arranged:    DME Agency:     HH Arranged:    HH Agency:     Status of Service:  Completed, signed off  If discussed at [employer] Length of Stay Meetings, dates discussed:    Additional Comments:  Lawerance SabalDebbie Jisselle Poth, RN 04/17/2016, 11:22 AM

## 2016-04-17 NOTE — Progress Notes (Signed)
SATURATION QUALIFICATIONS: (This note is used to comply with regulatory documentation for home oxygen)  Patient Saturations on Room Air at Rest = 90%  Patient Saturations on Room Air while Ambulating = 83%  Patient Saturations on 2 Liters of oxygen while Ambulating = 93%  Please briefly explain why patient needs home oxygen:  Weak and hypoxic today ambulating on room air.    Hillmanyndi Azariel Banik, [place] [phone] 04/17/2016

## 2016-04-17 NOTE — Progress Notes (Signed)
Physical Therapy Treatment Patient Details Name: Nichole Hunt MRN: [id number] DOB: 1940/09/24 Today's Date: 04/17/2016    History of Present Illness Patient is a 76 year old female with history of chronic bronchitis, COPD, former smoker, DSD, was admitted for URI type symptoms causing COPD exacerbation with shortness of breath and significant wheezing.    PT Comments    Patient not improving due to hypoxia this session, improved O2 sats on O2, but weaker and not able to walk as far, still needing physical help.  May need HHPT and possibly walking device due to continued weakness and needing to negotiate stairs at home.   Follow Up Recommendations  Home health PT     Equipment Recommendations  Other (comment) (TBA, ? walker)    Recommendations for Other Services       Precautions / Restrictions Precautions Precautions: Fall Precaution Comments: watch O2 sats    Mobility  Bed Mobility Overal bed mobility: Modified Independent                Transfers Overall transfer level: Needs assistance   Transfers: Sit to/from Stand Sit to Stand: Supervision            Ambulation/Gait Ambulation/Gait assistance: Min assist Ambulation Distance (Feet): 130 Feet (and 100') Assistive device: None Gait Pattern/deviations: Step-through pattern;Decreased stride length;Step-to pattern;Trunk flexed;Drifts right/left     General Gait Details: very slow, pauses to rest after about 30', holding wall rail and Dinamap (for O2 measurement); noted low SpO2 so return to the room and applied O2 for second walk   Stairs            Wheelchair Mobility    Modified Rankin (Stroke Patients Only)       Balance Overall balance assessment: Needs assistance           Standing balance-Leahy Scale: Good Standing balance comment: static balance good, some imbalance with ambulation                    Cognition Arousal/Alertness: Awake/alert Behavior During Therapy: WFL  for tasks assessed/performed Overall Cognitive Status: Within Functional Limits for tasks assessed                      Exercises      General Comments General comments (skin integrity, edema, etc.): SpO2 83% x 2 during ambulation on RA; then up to 92-94% ambulating on O2 (and after flutter valve)      Pertinent Vitals/Pain Faces Pain Scale: Hurts little more Pain Location: generalized aches Pain Descriptors / Indicators: Aching;Sore Pain Intervention(s): Monitored during session    Home Living                      Prior Function            PT Goals (current goals can now be found in the care plan section) Progress towards PT goals: Not progressing toward goals - comment    Frequency    Min 3X/week      PT Plan Discharge plan needs to be updated;Equipment recommendations need to be updated    Co-evaluation             End of Session Equipment Utilized During Treatment: Gait belt;Oxygen Activity Tolerance: Patient limited by fatigue Patient left: in bed;with call bell/phone within reach     Time: 1218-1241 PT Time Calculation (min) (ACUTE ONLY): 23 min  Charges:  $Gait Training: 23-37 mins  G CodesElray Mcgregor 04/17/2016, 1:07 PM  Sheran Lawless, PT [phone] 04/17/2016

## 2016-04-17 NOTE — Progress Notes (Signed)
Initial Nutrition Assessment  DOCUMENTATION CODES:   Not applicable  INTERVENTION:   Ensure Enlive po BID, each supplement provides 350 kcal and 20 grams of protein  MVI daily   NUTRITION DIAGNOSIS:   Inadequate oral intake related to acute illness as evidenced by per patient/family report, meal completion < 50%.  GOAL:   Patient will meet greater than or equal to 90% of their needs  MONITOR:   PO intake, Supplement acceptance  REASON FOR ASSESSMENT:   Consult Assessment of nutrition requirement/status  ASSESSMENT:   76 y.o. female with medical history significant of  bronchitis; who presents with complaints of a 3 day history of nonproductive cough. Admitted for COPD exacerbation secondary to suspected viral infection   Met with pt in room today. Pt reports poor appetite and eating 25% meals. Pt reports that her appetite has been poor for several days pta r/t cough. Pt reports stable wts. Pt stopped eating meat about 6 months ago and has concerns about getting the nutrients she needs from other sources. Pt concerned that MD told her she was pre-diabetic. Spoke to pt at length about eating diabetic friendly diet and on getting the proper nutrients as a vegetarian.    Medications reviewed and include: tums, celexa, doxycycline, zofran  Labs reviewed: Ca 8.6(L)  Nutrition-Focused physical exam completed. Findings are no fat depletion, no muscle depletion, and no edema.   Diet Order:  Diet Heart Room service appropriate? Yes; Fluid consistency: Thin Diet regular Room service appropriate? Yes; Fluid consistency: Thin  Skin:  Reviewed, no issues  Last BM:  12/31  Height:   Ht Readings from Last 1 Encounters:  04/16/16 5' 4.8" (1.646 m)    Weight:   Wt Readings from Last 1 Encounters:  04/17/16 181 lb 14.1 oz (82.5 kg)    Ideal Body Weight:  56.3 kg  BMI:  Body mass index is 30.45 kg/m.  Estimated Nutritional Needs:   Kcal:  1400-1700kcal/day   Protein:   82-98g/day  Fluid:  >1.4L/day   EDUCATION NEEDS:   No education needs identified at this time  Koleen Distance, RD, LDN Pager #[phone]

## 2016-04-17 NOTE — Progress Notes (Signed)
Physical Therapy ambulated patient and made RN aware that patient;s Sp02 on room air was 93%. Dr. Randol KernElgergawy paged to make aware.

## 2016-04-18 DIAGNOSIS — J111 Influenza due to unidentified influenza virus with other respiratory manifestations: Principal | ICD-10-CM

## 2016-04-18 MED ORDER — METHYLPREDNISOLONE SODIUM SUCC 40 MG IJ SOLR
40.0000 mg | Freq: Two times a day (BID) | INTRAMUSCULAR | Status: DC
  Administered 2016-04-19: 40 mg via INTRAVENOUS
  Filled 2016-04-18: qty 1

## 2016-04-18 NOTE — Progress Notes (Signed)
Patient was ambulating in hall and stated she was feeling "much better" today. Sp02 checked and 89-90% on room air while ambulating.

## 2016-04-18 NOTE — Progress Notes (Signed)
PROGRESS NOTE                                                                                                                                                                                                             Patient Demographics:    Nichole Hunt, is a 76 y.o. female, DOB - 1940/05/27, AVW:[id number]  Admit date - 04/15/2016   Admitting Physician Clydie Braunondell A Smith, MD  Outpatient Primary MD for the patient is No PCP Per Patient  LOS - 1   Chief Complaint  Patient presents with  . Emesis  . Cough       Brief Narrative   76 year old female with history of chronic bronchitis, COPD, former smoker, DSD, was admitted for URI type symptoms causing COPD exacerbation with shortness of breath and significant wheezing, she is influenza positive as well   Subjective:    Nichole IngPamela Mkrtchyan today has, No headache, No chest pain, No abdominal pain - No Nausea, Report generalized weakness and fatigue, complains of cough, productive, and shortness of breath    Assessment  & Plan :    Principal Problem:   COPD exacerbation (HCC) Active Problems:   Hypoxia   Hypokalemia   PTSD (post-traumatic stress disorder)   COPD exacerbation  - secondary to viral infection, Patient presents with complaints of cough, wheezing , shortness of breath, fever, and muscle aches, . Chest x-ray otherwise clear. - Wheezing significantly improved, initially hypoxic on ambulation, improving on IV steroids, will taper dose steroids hoping can be transitioned to oral prednisone in 1-2 days . - Continue with Pulmicort  - Continue with scheduled neb, Pulmicort, and doxycycline  Influenza infection - Continue with Tamiflu  Acute hypoxic respiratory failure - due  to above, hypoxic on ambulation, but he likely will need home oxygen on discharge  Hypokalemia - repleted,   PTSD - Continue prazosin and Celexa  Code Status : Full  Family Communication  : None at  bedside  Disposition Plan  : Home when stable  Consults  :  None  Procedures  : None  DVT Prophylaxis  :  Lovenox   Lab Results  Component Value Date   PLT 167 04/16/2016    Antibiotics  :    Anti-infectives    Start     Dose/Rate Route Frequency Ordered Stop   04/16/16 1800  doxycycline (VIBRA-TABS) tablet 100 mg     100 mg Oral Every evening 04/16/16 0120     04/16/16 1700  oseltamivir (TAMIFLU) capsule 75 mg     75 mg Oral 2 times daily 04/16/16 1532 04/21/16 2159   04/15/16 2245  doxycycline (VIBRA-TABS) tablet 100 mg     100 mg Oral  Once 04/15/16 2230 04/15/16 2238        Objective:   Vitals:   04/18/16 0439 04/18/16 0729 04/18/16 0842 04/18/16 1330  BP: (!) 152/77   (!) 141/64  Pulse: (!) 54   84  Resp: 18   18  Temp: 97.8 F (36.6 C)   98.2 F (36.8 C)  TempSrc: Oral   Oral  SpO2: 94% 95% 92% 91%  Weight:      Height:        Wt Readings from Last 3 Encounters:  04/17/16 82.5 kg (181 lb 14.1 oz)  03/11/12 75.8 kg (167 lb)     Intake/Output Summary (Last 24 hours) at 04/18/16 1506 Last data filed at 04/17/16 1837  Gross per 24 hour  Intake              720 ml  Output              650 ml  Net               70 ml     Physical Exam  Awake Alert, Oriented X 3, No new F.N deficits, Normal affect Supple Neck,No JVD, No cervical lymphadenopathy appriciated.  Symmetrical Chest wall movement, Good air movement bilaterally, Minimal wheezing RRR,No Gallops,Rubs or new Murmurs, No Parasternal Heave +ve B.Sounds, Abd Soft, No tenderness, No rebound - guarding or rigidity. No Cyanosis, Clubbing or edema, No new Rash or bruise      Data Review:    CBC  Recent Labs Lab 04/15/16 1934 04/16/16 0810  WBC 6.5 4.3  HGB 13.2 13.1  HCT 39.8 39.8  PLT 178 167  MCV 91.3 92.1  MCH 30.3 30.3  MCHC 33.2 32.9  RDW 12.7 13.2  LYMPHSABS 2.0  --   MONOABS 0.8  --   EOSABS 0.0  --   BASOSABS 0.0  --     Chemistries   Recent Labs Lab 04/15/16 1934  04/16/16 0810  NA 133* 136  K 3.4* 4.1  CL 99* 105  CO2 23 26  GLUCOSE 93 129*  BUN 10 6  CREATININE 0.73 0.83  CALCIUM 8.9 8.6*  AST 31  --   ALT 25  --   ALKPHOS 59  --   BILITOT 0.5  --    ------------------------------------------------------------------------------------------------------------------ No results for input(s): CHOL, HDL, LDLCALC, TRIG, CHOLHDL, LDLDIRECT in the last 72 hours.  No results found for: HGBA1C ------------------------------------------------------------------------------------------------------------------ No results for input(s): TSH, T4TOTAL, T3FREE, THYROIDAB in the last 72 hours.  Invalid input(s): FREET3 ------------------------------------------------------------------------------------------------------------------ No results for input(s): VITAMINB12, FOLATE, FERRITIN, TIBC, IRON, RETICCTPCT in the last 72 hours.  Coagulation profile No results for input(s): INR, PROTIME in the last 168 hours.  No results for input(s): DDIMER in the last 72 hours.  Cardiac Enzymes  Recent Labs Lab 04/15/16 1934  TROPONINI <0.03   ------------------------------------------------------------------------------------------------------------------ No results found for: BNP  Inpatient Medications  Scheduled Meds: . budesonide (PULMICORT) nebulizer solution  0.5 mg Nebulization BID  . calcium carbonate  1 tablet Oral TID WC  . citalopram  20 mg Oral Daily  . doxycycline  100 mg Oral QPM  . enoxaparin (  LOVENOX) injection  40 mg Subcutaneous Q24H  . feeding supplement (ENSURE ENLIVE)  237 mL Oral BID BM  . guaiFENesin  1,200 mg Oral BID  . ipratropium-albuterol  3 mL Nebulization TID  . [START ON 04/19/2016] methylPREDNISolone (SOLU-MEDROL) injection  40 mg Intravenous Q12H  . multivitamin with minerals  1 tablet Oral Daily  . oseltamivir  75 mg Oral BID  . pantoprazole  40 mg Oral Daily  . prazosin  2 mg Oral QHS  . traZODone  50 mg Oral QHS    Continuous Infusions: PRN Meds:.acetaminophen **OR** acetaminophen, ALPRAZolam, alum & mag hydroxide-simeth, benzonatate, chlorpheniramine-HYDROcodone, guaiFENesin-dextromethorphan, ipratropium-albuterol, ondansetron **OR** ondansetron (ZOFRAN) IV  Micro Results Recent Results (from the past 240 hour(s))  Respiratory Panel by PCR     Status: Abnormal   Collection Time: 04/16/16  2:13 AM  Result Value Ref Range Status   Adenovirus NOT DETECTED NOT DETECTED Final   Coronavirus 229E NOT DETECTED NOT DETECTED Final   Coronavirus HKU1 NOT DETECTED NOT DETECTED Final   Coronavirus NL63 NOT DETECTED NOT DETECTED Final   Coronavirus OC43 NOT DETECTED NOT DETECTED Final   Metapneumovirus NOT DETECTED NOT DETECTED Final   Rhinovirus / Enterovirus NOT DETECTED NOT DETECTED Final   Influenza A NOT DETECTED NOT DETECTED Final   Influenza B DETECTED (A) NOT DETECTED Final   Parainfluenza Virus 1 NOT DETECTED NOT DETECTED Final   Parainfluenza Virus 2 NOT DETECTED NOT DETECTED Final   Parainfluenza Virus 3 NOT DETECTED NOT DETECTED Final   Parainfluenza Virus 4 NOT DETECTED NOT DETECTED Final   Respiratory Syncytial Virus NOT DETECTED NOT DETECTED Final   Bordetella pertussis NOT DETECTED NOT DETECTED Final   Chlamydophila pneumoniae NOT DETECTED NOT DETECTED Final   Mycoplasma pneumoniae NOT DETECTED NOT DETECTED Final    Radiology Reports Dg Chest 2 View  Result Date: 04/15/2016 CLINICAL DATA:  Cough, nausea, vomiting and diarrhea for the past 2 days. EXAM: CHEST  2 VIEW COMPARISON:  None. FINDINGS: The heart size and mediastinal contours are within normal limits. Minimal aortic atherosclerosis noted. Both lungs are clear. The visualized skeletal structures are unremarkable. IMPRESSION: No active cardiopulmonary disease.  Aortic atherosclerosis. Electronically Signed   By: Tollie Eth M.D.   On: 04/15/2016 17:07       Chelli Yerkes M.D on 04/18/2016 at 3:06 PM  Between 7am to 7pm -  Pager - [phone]  After 7pm go to [email or web address] - password [email or web address]  [facility] -  Office  [phone]

## 2016-04-19 MED ORDER — OSELTAMIVIR PHOSPHATE 75 MG PO CAPS: 75.0000 mg | ORAL_CAPSULE | Freq: Two times a day (BID) | ORAL | 0 refills | Status: DC

## 2016-04-19 MED ORDER — PREDNISONE 20 MG PO TABS
40.0000 mg | ORAL_TABLET | Freq: Every day | ORAL | Status: DC
  Administered 2016-04-19: 40 mg via ORAL
  Filled 2016-04-19 (×2): qty 2

## 2016-04-19 MED ORDER — PANTOPRAZOLE SODIUM 40 MG PO TBEC: 40.0000 mg | DELAYED_RELEASE_TABLET | Freq: Every day | ORAL | 0 refills | Status: DC

## 2016-04-19 MED ORDER — PREDNISONE 10 MG PO TABS: ORAL_TABLET | ORAL | 0 refills | Status: DC

## 2016-04-19 MED ORDER — IPRATROPIUM-ALBUTEROL 0.5-2.5 (3) MG/3ML IN SOLN: 3.0000 mL | Freq: Four times a day (QID) | RESPIRATORY_TRACT | 0 refills | Status: DC | PRN

## 2016-04-19 MED ORDER — GUAIFENESIN ER 600 MG PO TB12: 1200.0000 mg | ORAL_TABLET | Freq: Two times a day (BID) | ORAL | 0 refills | Status: DC

## 2016-04-19 NOTE — Progress Notes (Signed)
Physical Therapy Treatment Patient Details Name: Bronson Ingamela Schriever MRN: [id number] DOB: 1941-02-21 Today's Date: 04/19/2016    History of Present Illness Patient is a 76 year old female with history of chronic bronchitis, COPD, former smoker, DSD, was admitted for URI type symptoms causing COPD exacerbation with shortness of breath and significant wheezing.    PT Comments    Pt was too fatigued from just walking with nursing to do PT but had many questions related to her ongoing symptoms and follow up care.  Spoke with pt then case manager to plan her follow up from HHPT, and pt is comfortable with the idea of outpt to do endurance and strength training to recover the last year of SOB from chronic bronchitis.  Pt has new wearable fitness monitor and discussed the appropriateness of its' use with her.    Follow Up Recommendations  Home health PT     Equipment Recommendations  None recommended by PT (wants to follow up with outpatient)    Recommendations for Other Services       Precautions / Restrictions      Mobility  Bed Mobility                  Transfers                    Ambulation/Gait                 Stairs            Wheelchair Mobility    Modified Rankin (Stroke Patients Only)       Balance                                    Cognition                            Exercises      General Comments        Pertinent Vitals/Pain      Home Living                      Prior Function            PT Goals (current goals can now be found in the care plan section) Progress towards PT goals: Progressing toward goals    Frequency    Min 3X/week      PT Plan Current plan remains appropriate    Co-evaluation             End of Session   Activity Tolerance: Patient limited by fatigue Patient left: in bed;with call bell/phone within reach     Time: 1355-1411 PT Time Calculation  (min) (ACUTE ONLY): 16 min  Charges:  $Self Care/Home Management: 8-22                    G Codes:      Ivar DrapeStout, Aleeta Schmaltz E 04/19/2016, 2:16 PM    Samul Dadauth Dorman Calderwood, PT MS Acute Rehab Dept. Number: [facility] [phone] and [facility] [phone]

## 2016-04-19 NOTE — Progress Notes (Signed)
Discharge instructions given. Pt verbalized understanding and all questions were answered.  

## 2016-04-19 NOTE — Discharge Instructions (Signed)
Follow with Primary MD in 7 days (or keep your appointment with pulmonary for next week)  Get CBC, CMP, 2 view Chest X ray checked  by Primary MD next visit.    Activity: As tolerated with Full fall precautions use walker/cane & assistance as needed   Disposition Home    Diet: Heart Healthy  , with feeding assistance and aspiration precautions.  For Heart failure patients - Check your Weight same time everyday, if you gain over 2 pounds, or you develop in leg swelling, experience more shortness of breath or chest pain, call your Primary MD immediately. Follow Cardiac Low Salt Diet and 1.5 lit/day fluid restriction.   On your next visit with your primary care physician please Get Medicines reviewed and adjusted.   Please request your Prim.MD to go over all Hospital Tests and Procedure/Radiological results at the follow up, please get all Hospital records sent to your Prim MD by signing hospital release before you go home.   If you experience worsening of your admission symptoms, develop shortness of breath, life threatening emergency, suicidal or homicidal thoughts you must seek medical attention immediately by calling 911 or calling your MD immediately  if symptoms less severe.  You Must read complete instructions/literature along with all the possible adverse reactions/side effects for all the Medicines you take and that have been prescribed to you. Take any new Medicines after you have completely understood and accpet all the possible adverse reactions/side effects.   Do not drive, operating heavy machinery, perform activities at heights, swimming or participation in water activities or provide baby sitting services if your were admitted for syncope or siezures until you have seen by Primary MD or a Neurologist and advised to do so again.  Do not drive when taking Pain medications.    Do not take more than prescribed Pain, Sleep and Anxiety Medications  Special Instructions: If you  have smoked or chewed Tobacco  in the last 2 yrs please stop smoking, stop any regular Alcohol  and or any Recreational drug use.  Wear Seat belts while driving.   Please note  You were cared for by a hospitalist during your hospital stay. If you have any questions about your discharge medications or the care you received while you were in the hospital after you are discharged, you can call the unit and asked to speak with the hospitalist on call if the hospitalist that took care of you is not available. Once you are discharged, your primary care physician will handle any further medical issues. Please note that NO REFILLS for any discharge medications will be authorized once you are discharged, as it is imperative that you return to your primary care physician (or establish a relationship with a primary care physician if you do not have one) for your aftercare needs so that they can reassess your need for medications and monitor your lab values.

## 2016-04-19 NOTE — Care Management Note (Signed)
Case Management Note  Patient Details  Name: Nichole Hunt MRN: [id number] Date of Birth: [birth year]  Subjective/Objective:     COPD exac                Action/Plan: Discharge Planning: AVS reviewed:  NCM spoke to pt and offered choice for HH/list provided. [facility]. [facility] for [facility]t. Appt arranged for May 03, 2016 at 9:30 am. [facility]. Contacted [facility] DME rep for RW and neb machine for home. Pt lives in home with son.    Expected Discharge Date:  04/19/2016              Expected Discharge Plan:  Home w Home Health Services  In-House Referral:  NA  Discharge planning Services  CM Consult  Post Acute Care Choice:  Home Health Choice offered to:  Patient  DME Arranged:  Walker rolling, Nebulizer machine DME Agency:  [facility]  HH Arranged:  PT HH Agency:  [facility]  Status of Service:  Completed, signed off  If discussed at Long Length of Stay Meetings, dates discussed:    Additional Comments:  Elliot CousinShavis, Lorielle Boehning Ellen, RN 04/19/2016, 2:56 PM

## 2016-04-19 NOTE — Care Management Important Message (Signed)
Important Message  Patient Details  Name: Nichole Hunt MRN: [id number] Date of Birth: 1941/03/17   Medicare Important Message Given:  Yes    Elliot CousinShavis, Algenis Ballin Ellen, RN 04/19/2016, 3:10 PM

## 2016-04-19 NOTE — Discharge Summary (Signed)
Nichole Hunt, is a 76 y.o. female  DOB 03-25-1941  MRN [id number].  Admission date:  04/15/2016  Admitting Physician  Clydie Braun, MD  Discharge Date:  04/19/2016   Primary MD  No PCP Per Patient  Recommendations for primary care physician for things to follow:  - Patient to follow-up with pulmonary as outpatient, she tells me her daughter to schedule an appointment for next week   Admission Diagnosis  Chronic obstructive pulmonary disease with acute exacerbation (HCC) [J44.1] Nausea vomiting and diarrhea [R11.2, R19.7]   Discharge Diagnosis  Chronic obstructive pulmonary disease with acute exacerbation (HCC) [J44.1] Nausea vomiting and diarrhea [R11.2, R19.7]    Principal Problem:   COPD exacerbation (HCC) Active Problems:   Hypoxia   Hypokalemia   PTSD (post-traumatic stress disorder)      Past Medical History:  Diagnosis Date  . Diverticulitis   . Vertigo     Past Surgical History:  Procedure Laterality Date  . EYE SURGERY         History of present illness and  Hospital Course:     Kindly see H&P for history of present illness and admission details, please review complete Labs, Consult reports and Test reports for all details in brief  HPI  from the history and physical done on the day of admission 04/17/2015  HPI: Nichole Hunt is a 76 y.o. female with medical history significant of  bronchitis; who presents with complaints of a 3 day history of nonproductive cough. She just flew back from [place] on December 3 and has not setup care with a primary care provider yet. Patient notes that symptoms started after she stopped taking her Flovent inhaler.  Associated symptoms included subjective fever, wheezing, chills, muscle aches, headache, nausea, vomiting, diarrhea, dyspnea on exertion, and decreased oral intake. She reports that she feels that she is coughing so much that it causes  her to vomit. Denies having any dysuria, loss of consciousness, weight gain, leg swelling, or leg pain.  ED Course: Lab work relatively unremarkable except for potassium 3.4, and troponin  <0.03. O2 saturations dropped to 88% with ambulation. CXR clear. Lab work Given prednisone 60mg  po, 2 DuoNebs, antiemetics, 1 L NS IVF, and doxycycline. Transfer to a MedSurg bed at [facility].   Hospital Course   76 year old female with history of chronic bronchitis, COPD, former smoker, DSD, was admitted for URI type symptoms causing COPD exacerbation with shortness of breath and significant wheezing, she is influenza positive as well  COPD exacerbation  - secondary to viral infection, Patient presents with complaints of cough, wheezing , shortness of breath, fever, and muscle aches, . Chest x-ray otherwise clear. - Wheezing significantly improved, initially hypoxic on ambulation, improving on IV steroids, tapered to oral steroids today, been doing very well, saturating 95% on ambulation on room air, continue steroid taper over the next few days, we'll discharge on nebulizer treatment as well. Treatment with doxycycline during hospital stay, stop on discharge.   Influenza infection - Continue with Tamiflu, to finish  another 3 doses as outpatient  Acute hypoxic respiratory failure - due  to above, hypoxic on ambulation, resolved, saturation 95% on room air with no dyspnea  Hypokalemia - repleted,   PTSD - Continue prazosin and Celexa    Discharge Condition:  Stable   Follow UP  Follow-up Information    [facility] Follow up on 05/03/2016.   Specialty:  Internal Medicine Why:  follow up appointment with Primary Care Physician on May 03, 2016 at 9:30 am Contact information: [address] [place] [address] [phone]       [facility] Follow up.   Why:  Home Health Physical Therapy Contact information: [address] [place] [address] [phone]        Isidoro Donning RN Follow up.   Contact information: RN Case Manager [phone]            Discharge Instructions  and  Discharge Medications     Discharge Instructions    Discharge instructions    Complete by:  As directed    Follow with Primary MD in 7 days (or keep your appointment with pulmonary for next week)  Get CBC, CMP, 2 view Chest X ray checked  by Primary MD next visit.    Activity: As tolerated with Full fall precautions use walker/cane & assistance as needed   Disposition Home    Diet: Heart Healthy  , with feeding assistance and aspiration precautions.  For Heart failure patients - Check your Weight same time everyday, if you gain over 2 pounds, or you develop in leg swelling, experience more shortness of breath or chest pain, call your Primary MD immediately. Follow Cardiac Low Salt Diet and 1.5 lit/day fluid restriction.   On your next visit with your primary care physician please Get Medicines reviewed and adjusted.   Please request your Prim.MD to go over all Hospital Tests and Procedure/Radiological results at the follow up, please get all Hospital records sent to your Prim MD by signing hospital release before you go home.   If you experience worsening of your admission symptoms, develop shortness of breath, life threatening emergency, suicidal or homicidal thoughts you must seek medical attention immediately by calling 911 or calling your MD immediately  if symptoms less severe.  You Must read complete instructions/literature along with all the possible adverse reactions/side effects for all the Medicines you take and that have been prescribed to you. Take any new Medicines after you have completely understood and accpet all the possible adverse reactions/side effects.   Do not drive, operating heavy machinery, perform activities at heights, swimming or participation in water activities or provide  baby sitting services if your were admitted for syncope or siezures until you have seen by Primary MD or a Neurologist and advised to do so again.  Do not drive when taking Pain medications.    Do not take more than prescribed Pain, Sleep and Anxiety Medications  Special Instructions: If you have smoked or chewed Tobacco  in the last 2 yrs please stop smoking, stop any regular Alcohol  and or any Recreational drug use.  Wear Seat belts while driving.   Please note  You were cared for by a hospitalist during your hospital stay. If you have any questions about your discharge medications or the care you received while you were in the hospital after you are discharged, you can call the unit and asked to speak with the hospitalist on  call if the hospitalist that took care of you is not available. Once you are discharged, your primary care physician will handle any further medical issues. Please note that NO REFILLS for any discharge medications will be authorized once you are discharged, as it is imperative that you return to your primary care physician (or establish a relationship with a primary care physician if you do not have one) for your aftercare needs so that they can reassess your need for medications and monitor your lab values.   Increase activity slowly    Complete by:  As directed      Allergies as of 04/19/2016   No Known Allergies     Medication List    STOP taking these medications   ibuprofen 200 MG tablet Commonly known as:  ADVIL,MOTRIN     TAKE these medications   albuterol 108 (90 Base) MCG/ACT inhaler Commonly known as:  PROVENTIL HFA;VENTOLIN HFA Inhale into the lungs every 6 (six) hours as needed for wheezing or shortness of breath.   atorvastatin 10 MG tablet Commonly known as:  LIPITOR Take 10 mg by mouth daily.   calcium carbonate 500 MG chewable tablet Commonly known as:  TUMS - dosed in mg elemental calcium Chew 1-2 tablets by mouth 4 (four) times daily  as needed for indigestion or heartburn.   citalopram 20 MG tablet Commonly known as:  CELEXA Take 20 mg by mouth daily.   fluticasone 220 MCG/ACT inhaler Commonly known as:  FLOVENT HFA Inhale 2 puffs into the lungs 2 (two) times daily.   guaiFENesin 600 MG 12 hr tablet Commonly known as:  MUCINEX Take 2 tablets (1,200 mg total) by mouth 2 (two) times daily.   ipratropium-albuterol 0.5-2.5 (3) MG/3ML Soln Commonly known as:  DUONEB Take 3 mLs by nebulization every 6 (six) hours as needed.   metFORMIN 500 MG tablet Commonly known as:  GLUCOPHAGE Take 500 mg by mouth 2 (two) times daily with a meal.   oseltamivir 75 MG capsule Commonly known as:  TAMIFLU Take 1 capsule (75 mg total) by mouth 2 (two) times daily.   pantoprazole 40 MG tablet Commonly known as:  PROTONIX Take 1 tablet (40 mg total) by mouth daily. Start taking on:  04/20/2016   prazosin 2 MG capsule Commonly known as:  MINIPRESS Take 2 mg by mouth at bedtime.   predniSONE 10 MG tablet Commonly known as:  DELTASONE Please take 40 mg oral daily for 1 day, then 30 mg oral daily for 2 days, then 20 mg oral daily for 2 days, then 10 mg oral daily for 2 days, then stop   traZODone 50 MG tablet Commonly known as:  DESYREL Take 50 mg by mouth at bedtime.            Durable Medical Equipment        Start     Ordered   04/19/16 1455  For home use only DME Walker rolling  Once    Question:  Patient needs a walker to treat with the following condition  Answer:  COPD (chronic obstructive pulmonary disease) (HCC)   04/19/16 1454   04/19/16 1101  For home use only DME Nebulizer machine  Once    Question:  Patient needs a nebulizer to treat with the following condition  Answer:  Wheezing   04/19/16 1101        Diet and Activity recommendation: See Discharge Instructions above   Consults obtained -  none   Major procedures  and Radiology Reports - PLEASE review detailed and final reports for all details,  in brief -      Dg Chest 2 View  Result Date: 04/15/2016 CLINICAL DATA:  Cough, nausea, vomiting and diarrhea for the past 2 days. EXAM: CHEST  2 VIEW COMPARISON:  None. FINDINGS: The heart size and mediastinal contours are within normal limits. Minimal aortic atherosclerosis noted. Both lungs are clear. The visualized skeletal structures are unremarkable. IMPRESSION: No active cardiopulmonary disease.  Aortic atherosclerosis. Electronically Signed   By: Tollie Eth M.D.   On: 04/15/2016 17:07    Micro Results     Recent Results (from the past 240 hour(s))  Respiratory Panel by PCR     Status: Abnormal   Collection Time: 04/16/16  2:13 AM  Result Value Ref Range Status   Adenovirus NOT DETECTED NOT DETECTED Final   Coronavirus 229E NOT DETECTED NOT DETECTED Final   Coronavirus HKU1 NOT DETECTED NOT DETECTED Final   Coronavirus NL63 NOT DETECTED NOT DETECTED Final   Coronavirus OC43 NOT DETECTED NOT DETECTED Final   Metapneumovirus NOT DETECTED NOT DETECTED Final   Rhinovirus / Enterovirus NOT DETECTED NOT DETECTED Final   Influenza A NOT DETECTED NOT DETECTED Final   Influenza B DETECTED (A) NOT DETECTED Final   Parainfluenza Virus 1 NOT DETECTED NOT DETECTED Final   Parainfluenza Virus 2 NOT DETECTED NOT DETECTED Final   Parainfluenza Virus 3 NOT DETECTED NOT DETECTED Final   Parainfluenza Virus 4 NOT DETECTED NOT DETECTED Final   Respiratory Syncytial Virus NOT DETECTED NOT DETECTED Final   Bordetella pertussis NOT DETECTED NOT DETECTED Final   Chlamydophila pneumoniae NOT DETECTED NOT DETECTED Final   Mycoplasma pneumoniae NOT DETECTED NOT DETECTED Final       Today   Subjective:   Nichole Hunt today has no headache,no chest or abdominal pain, reports she is feeling much better today, complains of occasional cough,   Objective:   Blood pressure (!) 144/87, pulse 70, temperature 97.8 F (36.6 C), temperature source Oral, resp. rate 16, height 5' 4.8" (1.646 m),  weight 82.5 kg (181 lb 14.1 oz), SpO2 96 %.   Intake/Output Summary (Last 24 hours) at 04/19/16 1514 Last data filed at 04/18/16 2200  Gross per 24 hour  Intake               60 ml  Output                0 ml  Net               60 ml    Exam Awake Alert, Oriented x 3, No new F.N deficits, Normal affect .AT,PERRAL Supple Neck,No JVD, No cervical lymphadenopathy appriciated.  Symmetrical Chest wall movement, Good air movement bilaterally, CTAB RRR,No Gallops,Rubs or new Murmurs, No Parasternal Heave +ve B.Sounds, Abd Soft, Non tender, No organomegaly appriciated, No rebound -guarding or rigidity. No Cyanosis, Clubbing or edema, No new Rash or bruise  Data Review   CBC w Diff: Lab Results  Component Value Date   WBC 4.3 04/16/2016   HGB 13.1 04/16/2016   HCT 39.8 04/16/2016   PLT 167 04/16/2016   LYMPHOPCT 30 04/15/2016   MONOPCT 12 04/15/2016   EOSPCT 0 04/15/2016   BASOPCT 0 04/15/2016    CMP: Lab Results  Component Value Date   NA 136 04/16/2016   K 4.1 04/16/2016   CL 105 04/16/2016   CO2 26 04/16/2016   BUN 6 04/16/2016   CREATININE  0.83 04/16/2016   PROT 7.0 04/15/2016   ALBUMIN 3.9 04/15/2016   BILITOT 0.5 04/15/2016   ALKPHOS 59 04/15/2016   AST 31 04/15/2016   ALT 25 04/15/2016  .   Total Time in preparing paper work, data evaluation and todays exam - 35 minutes  Chaseton Yepiz M.D on 04/19/2016 at 3:14 PM  [facility]   Office  [phone]

## 2016-04-22 ENCOUNTER — Telehealth: Payer: Self-pay | Admitting: General Practice

## 2016-04-22 NOTE — Telephone Encounter (Signed)
Nichole CitrinJanet Ford from [facility] called to speak with PCP to get the verbal order to have a nurse go out to see patient to educate her on how to use nebulizer as well as inhaler and other medical supplies. Pt has been out of her supplies since 1/5 and it is important that someone visits and educates her. Please follow her.Pt of Dr. Erin SonsLachina.   Thank you.

## 2016-04-23 NOTE — Telephone Encounter (Signed)
[facility] is not a [facility] or [facility]. No orders may be provided. Those orders need to be provided by the ED.

## 2016-04-25 ENCOUNTER — Encounter: Payer: Self-pay | Admitting: *Deleted

## 2016-04-26 ENCOUNTER — Ambulatory Visit (INDEPENDENT_AMBULATORY_CARE_PROVIDER_SITE_OTHER): Payer: Medicare HMO | Admitting: Pulmonary Disease

## 2016-04-26 ENCOUNTER — Ambulatory Visit (INDEPENDENT_AMBULATORY_CARE_PROVIDER_SITE_OTHER)
Admission: RE | Admit: 2016-04-26 | Discharge: 2016-04-26 | Disposition: A | Discharge status: Home / Self Care | Payer: 59 | Source: Ambulatory Visit | Attending: Pulmonary Disease | Admitting: Pulmonary Disease

## 2016-04-26 ENCOUNTER — Encounter: Payer: Self-pay | Admitting: Pulmonary Disease

## 2016-04-26 VITALS — BP 148/70 | HR 71 | Ht 64.0 in | Wt 181.4 lb

## 2016-04-26 DIAGNOSIS — J441 Chronic obstructive pulmonary disease with (acute) exacerbation: Secondary | ICD-10-CM

## 2016-04-26 NOTE — Patient Instructions (Signed)
Stop using the Flovent. We'll substitute this with Symbicort 160/4.5. Using a nebulizer and albuterol inhaler as needed  We will schedule for chest x-ray today. Continue using the flutter valve, We will see if we can get an incentive spirometer Follow-up in clinic in 1-2 months with PFTs on day of return visit.

## 2016-04-26 NOTE — Progress Notes (Signed)
Nichole Hunt    [id number]    [birth year]  Primary Care Physician:No PCP Per Patient  Referring Physician: No referring provider defined for this encounter.  Chief complaint:  Follow-up for COPD  HPI: [name] is a 76 year old with past medical history of bronchitis, former smoker. She was admitted in early January for COPD exacerbation, flu infection project symptoms of 3 days of nonproductive cough with fevers, wheezing, chills, dyspnea. She was treated with IV steroids, Tamiflu, doxycycline with improvement in symptoms. She is discharged on 04/20/15. In office today she complains of persistent cough with mucus production, chest congestion. She denies any dyspnea, wheezing. She is concerned that she may have developed pneumonia after her flu infection.  She had been maintained on Flovent inhaler in the past for chronic recurrent bronchitis. She was told that she may have borderline COPD but has no pulmonary function test on record. She spends her time between [place] and [place]. She is a former heavy smoker up to 3 packs per day. She quit in 1978.  Outpatient Encounter Prescriptions as of 04/26/2016  Medication Sig  . albuterol (PROVENTIL HFA;VENTOLIN HFA) 108 (90 Base) MCG/ACT inhaler Inhale into the lungs every 6 (six) hours as needed for wheezing or shortness of breath.  Marland Kitchen atorvastatin (LIPITOR) 10 MG tablet Take 10 mg by mouth daily.  . citalopram (CELEXA) 20 MG tablet Take 20 mg by mouth daily.  . fluticasone (FLOVENT HFA) 220 MCG/ACT inhaler Inhale 2 puffs into the lungs 2 (two) times daily.  Marland Kitchen guaiFENesin (MUCINEX) 600 MG 12 hr tablet Take 2 tablets (1,200 mg total) by mouth 2 (two) times daily.  Marland Kitchen ipratropium-albuterol (DUONEB) 0.5-2.5 (3) MG/3ML SOLN Take 3 mLs by nebulization every 6 (six) hours as needed.  . metFORMIN (GLUCOPHAGE) 500 MG tablet Take 500 mg by mouth 2 (two) times daily with a meal.  . prazosin (MINIPRESS) 2 MG capsule Take 2 mg by mouth at  bedtime.  . traZODone (DESYREL) 50 MG tablet Take 50 mg by mouth at bedtime.  . calcium carbonate (TUMS - DOSED IN MG ELEMENTAL CALCIUM) 500 MG chewable tablet Chew 1-2 tablets by mouth 4 (four) times daily as needed for indigestion or heartburn.  Marland Kitchen oseltamivir (TAMIFLU) 75 MG capsule Take 1 capsule (75 mg total) by mouth 2 (two) times daily. (Patient not taking: Reported on 04/26/2016)  . pantoprazole (PROTONIX) 40 MG tablet Take 1 tablet (40 mg total) by mouth daily. (Patient not taking: Reported on 04/26/2016)  . predniSONE (DELTASONE) 10 MG tablet Please take 40 mg oral daily for 1 day, then 30 mg oral daily for 2 days, then 20 mg oral daily for 2 days, then 10 mg oral daily for 2 days, then stop (Patient not taking: Reported on 04/26/2016)   No facility-administered encounter medications on file as of 04/26/2016.     Allergies as of 04/26/2016  . (No Known Allergies)    Past Medical History:  Diagnosis Date  . Diverticulitis   . Insomnia   . Major depression   . Vertigo     Past Surgical History:  Procedure Laterality Date  . COLONOSCOPY    . EYE SURGERY      Family History  Problem Relation Age of Onset  . Alcoholism Mother   . Alcoholism Father   . Heart attack Father   . Diabetes Sister   . Heart attack Sister   . Hypertension Sister   . Stroke Sister  Social History   Social History  . Marital status: Single    Spouse name: N/A  . Number of children: N/A  . Years of education: N/A   Occupational History  . Not on file.   Social History Main Topics  . Smoking status: Former Smoker    Packs/day: 3.00    Types: Cigarettes    Quit date: 04/15/1976  . Smokeless tobacco: Never Used  . Alcohol use No  . Drug use: No  . Sexual activity: No   Other Topics Concern  . Not on file   Social History Narrative  . No narrative on file     Review of systems: Review of Systems  Constitutional: Negative for fever and chills.  HENT: Negative.   Eyes: Negative  for blurred vision.  Respiratory: as per HPI  Cardiovascular: Negative for chest pain and palpitations.  Gastrointestinal: Negative for vomiting, diarrhea, blood per rectum. Genitourinary: Negative for dysuria, urgency, frequency and hematuria.  Musculoskeletal: Negative for myalgias, back pain and joint pain.  Skin: Negative for itching and rash.  Neurological: Negative for dizziness, tremors, focal weakness, seizures and loss of consciousness.  Endo/Heme/Allergies: Negative for environmental allergies.  Psychiatric/Behavioral: Negative for depression, suicidal ideas and hallucinations.  All other systems reviewed and are negative.   Physical Exam: Blood pressure (!) 148/70, pulse 71, height 5\' 4"  (1.626 m), weight 181 lb 6.4 oz (82.3 kg), SpO2 95 %. Gen:      No acute distress HEENT:  EOMI, sclera anicteric Neck:     No masses; no thyromegaly Lungs:    Clear to auscultation bilaterally; normal respiratory effort CV:         Regular rate and rhythm; no murmurs Abd:      + bowel sounds; soft, non-tender; no palpable masses, no distension Ext:    No edema; adequate peripheral perfusion Skin:      Warm and dry; no rash Neuro: alert and oriented x 3 Psych: normal mood and affect  Data Reviewed: CXR 04/15/16- No acute cardiopulmonary abnormality. Images reviewed.  BMET    Component Value Date/Time   NA 136 04/16/2016 0810   K 4.1 04/16/2016 0810   CL 105 04/16/2016 0810   CO2 26 04/16/2016 0810   GLUCOSE 129 (H) 04/16/2016 0810   BUN 6 04/16/2016 0810   CREATININE 0.83 04/16/2016 0810   CALCIUM 8.6 (L) 04/16/2016 0810   GFRNONAA >60 04/16/2016 0810   GFRAA >60 04/16/2016 0810   CBC    Component Value Date/Time   WBC 4.3 04/16/2016 0810   RBC 4.32 04/16/2016 0810   HGB 13.1 04/16/2016 0810   HCT 39.8 04/16/2016 0810   PLT 167 04/16/2016 0810   MCV 92.1 04/16/2016 0810   MCH 30.3 04/16/2016 0810   MCHC 32.9 04/16/2016 0810   RDW 13.2 04/16/2016 0810   LYMPHSABS 2.0  04/15/2016 1934   MONOABS 0.8 04/15/2016 1934   EOSABS 0.0 04/15/2016 1934   BASOSABS 0.0 04/15/2016 1934   Assessment:  Chronic bronchitis Recent admission for COPD exacerbation, flu infection She still has persistent cough with mucus production. I'll get a chest x-ray to make sure that she hasn't developed an infiltrate in the interim. I'll switch her Flovent to Symbicort. She'll continue to use the nebulizer and albuterol as needed.  She'll need pulmonary function tests but I prefer to wait till her next visit as she is just recovering from her recent hospitalization. She'll continue to use the flutter valve. We will try to get her  an incentive spirometer as well.  Plan/Recommendations: - Stop flovent. - Start symbicort - Flutter valve, incentive spirometer - CXR  Chilton GreathousePraveen Tacia Hindley MD [facility] Pager [phone] 04/26/2016, 11:40 AM  CC: No ref. provider found

## 2016-05-03 ENCOUNTER — Ambulatory Visit: Payer: Medicare Other | Admitting: Family Medicine

## 2016-05-08 ENCOUNTER — Other Ambulatory Visit: Payer: Self-pay | Admitting: Family Medicine

## 2016-05-08 ENCOUNTER — Ambulatory Visit (INDEPENDENT_AMBULATORY_CARE_PROVIDER_SITE_OTHER): Payer: 59 | Admitting: Family Medicine

## 2016-05-08 ENCOUNTER — Encounter: Payer: Self-pay | Admitting: Family Medicine

## 2016-05-08 ENCOUNTER — Telehealth: Payer: Self-pay | Admitting: Pulmonary Disease

## 2016-05-08 ENCOUNTER — Ambulatory Visit (INDEPENDENT_AMBULATORY_CARE_PROVIDER_SITE_OTHER)
Admission: RE | Admit: 2016-05-08 | Discharge: 2016-05-08 | Disposition: A | Discharge status: Home / Self Care | Payer: Medicare HMO | Source: Ambulatory Visit | Attending: Pulmonary Disease | Admitting: Pulmonary Disease

## 2016-05-08 VITALS — BP 153/70 | HR 88 | Temp 98.3°F | Resp 18 | Ht 64.0 in | Wt 181.0 lb

## 2016-05-08 DIAGNOSIS — J441 Chronic obstructive pulmonary disease with (acute) exacerbation: Secondary | ICD-10-CM

## 2016-05-08 DIAGNOSIS — R053 Chronic cough: Secondary | ICD-10-CM | POA: Insufficient documentation

## 2016-05-08 DIAGNOSIS — I10 Essential (primary) hypertension: Secondary | ICD-10-CM

## 2016-05-08 DIAGNOSIS — R05 Cough: Secondary | ICD-10-CM | POA: Diagnosis not present

## 2016-05-08 DIAGNOSIS — R7303 Prediabetes: Secondary | ICD-10-CM

## 2016-05-08 DIAGNOSIS — R509 Fever, unspecified: Secondary | ICD-10-CM

## 2016-05-08 DIAGNOSIS — J069 Acute upper respiratory infection, unspecified: Secondary | ICD-10-CM

## 2016-05-08 LAB — POCT URINALYSIS DIP (DEVICE)
Bilirubin Urine: NEGATIVE
Glucose, UA: NEGATIVE mg/dL
KETONES UR: NEGATIVE mg/dL
Nitrite: NEGATIVE
PH: 7 (ref 5.0–8.0)
PROTEIN: NEGATIVE mg/dL
SPECIFIC GRAVITY, URINE: 1.02 (ref 1.005–1.030)
UROBILINOGEN UA: 0.2 mg/dL (ref 0.0–1.0)

## 2016-05-08 LAB — COMPLETE METABOLIC PANEL WITH GFR
ALT: 23 U/L (ref 6–29)
AST: 25 U/L (ref 10–35)
Albumin: 3.7 g/dL (ref 3.6–5.1)
Alkaline Phosphatase: 57 U/L (ref 33–130)
BILIRUBIN TOTAL: 0.5 mg/dL (ref 0.2–1.2)
BUN: 11 mg/dL (ref 7–25)
CO2: 25 mmol/L (ref 20–31)
CREATININE: 1.06 mg/dL — AB (ref 0.60–0.93)
Calcium: 10.2 mg/dL (ref 8.6–10.4)
Chloride: 103 mmol/L (ref 98–110)
GFR, EST AFRICAN AMERICAN: 60 mL/min (ref >=60)
GFR, Est Non African American: 52 mL/min — ABNORMAL LOW (ref >=60)
Glucose, Bld: 118 mg/dL — ABNORMAL HIGH (ref 65–99)
Potassium: 4 mmol/L (ref 3.5–5.3)
Sodium: 138 mmol/L (ref 135–146)
TOTAL PROTEIN: 6.3 g/dL (ref 6.1–8.1)

## 2016-05-08 LAB — POCT GLYCOSYLATED HEMOGLOBIN (HGB A1C): HEMOGLOBIN A1C: 6.3

## 2016-05-08 MED ORDER — AMOXICILLIN-POT CLAVULANATE 875-125 MG PO TABS: 1.0000 | ORAL_TABLET | Freq: Two times a day (BID) | ORAL | 0 refills | Status: DC

## 2016-05-08 MED ORDER — PROMETHAZINE HCL 6.25 MG/5ML PO SOLN: 5.0000 mL | Freq: Four times a day (QID) | ORAL | 0 refills | Status: DC | PRN

## 2016-05-08 MED ORDER — AZITHROMYCIN 250 MG PO TABS: ORAL_TABLET | ORAL | 0 refills | Status: DC

## 2016-05-08 MED ORDER — AMLODIPINE BESYLATE 5 MG PO TABS: 5.0000 mg | ORAL_TABLET | Freq: Every day | ORAL | 0 refills | Status: DC

## 2016-05-08 MED ORDER — LEVOFLOXACIN 750 MG PO TABS: 750.0000 mg | ORAL_TABLET | Freq: Every day | ORAL | 0 refills | Status: DC

## 2016-05-08 MED ORDER — METFORMIN HCL 500 MG PO TABS: 500.0000 mg | ORAL_TABLET | Freq: Two times a day (BID) | ORAL | 1 refills | Status: AC

## 2016-05-08 MED ORDER — PREDNISONE 10 MG PO TABS: ORAL_TABLET | ORAL | 0 refills | Status: DC

## 2016-05-08 NOTE — Telephone Encounter (Signed)
Pt's granddaughter Nichole Hunt returned call Advised of PM's recommendations as stated below Nichole Hunt to bring pt for cxr and Rx's sent to verified pharmacy cxr ordered STAT Nothing further needed; will sign off

## 2016-05-08 NOTE — Telephone Encounter (Signed)
PM  Please Advise- Sick Message   Pt. granddaughter called in saying Nichole Hunt is coughing up blood, wheezing, chest congestion, increase sob, she is unable to sleep Denies fever.  We do not have any open appointments today. The granddaughter wanted to know what do you suggest that they do.

## 2016-05-08 NOTE — Telephone Encounter (Signed)
Ask her to get CXR, levaquin 750 mg qd for 7 days. Pre taper starting at 40 gm. Reduce dose by 10 mg every 3 days. If there is worsening she needs to got to ED

## 2016-05-08 NOTE — Telephone Encounter (Signed)
LMOM TCB x1 for pt's granddaughter Ladona Ridgelaylor.

## 2016-05-08 NOTE — Patient Instructions (Addendum)
  Reviewed previous blood pressures, will start a trial of Amlodipine 5 mg. Will return to clinic in 1 month.   For congested cough and upper respiratory symptoms, including fever.  Augmentin every 12 hours for 10 days  Will start Promethazine 5 ml every 6 hours as needed for persistent cough that interferes with sleep.  Continue inhalers as previously prescribed by pulmonology.   Upper Respiratory Infection, Adult Most upper respiratory infections (URIs) are caused by a virus. A URI affects the nose, throat, and upper air passages. The most common type of URI is often called "the common cold." Follow these instructions at home:  Take medicines only as told by your doctor.  Gargle warm saltwater or take cough drops to comfort your throat as told by your doctor.  Use a warm mist humidifier or inhale steam from a shower to increase air moisture. This may make it easier to breathe.  Drink enough fluid to keep your pee (urine) clear or pale yellow.  Eat soups and other clear broths.  Have a healthy diet.  Rest as needed.  Go back to work when your fever is gone or your doctor says it is okay.  You may need to stay home longer to avoid giving your URI to others.  You can also wear a face mask and wash your hands often to prevent spread of the virus.  Use your inhaler more if you have asthma.  Do not use any tobacco products, including cigarettes, chewing tobacco, or electronic cigarettes. If you need help quitting, ask your doctor. Contact a doctor if:  You are getting worse, not better.  Your symptoms are not helped by medicine.  You have chills.  You are getting more short of breath.  You have brown or red mucus.  You have yellow or brown discharge from your nose.  You have pain in your face, especially when you bend forward.  You have a fever.  You have puffy (swollen) neck glands.  You have pain while swallowing.  You have white areas in the back of your  throat. Get help right away if:  You have very bad or constant:  Headache.  Ear pain.  Pain in your forehead, behind your eyes, and over your cheekbones (sinus pain).  Chest pain.  You have long-lasting (chronic) lung disease and any of the following:  Wheezing.  Long-lasting cough.  Coughing up blood.  A change in your usual mucus.  You have a stiff neck.  You have changes in your:  Vision.  Hearing.  Thinking.  Mood. This information is not intended to replace advice given to you by your health care provider. Make sure you discuss any questions you have with your health care provider. Document Released: 09/18/2007 Document Revised: 12/03/2015 Document Reviewed: 07/07/2013 Elsevier Interactive Patient Education  2017 [employer]

## 2016-05-08 NOTE — Progress Notes (Signed)
Subjective:    Patient ID: Nichole Hunt, female    DOB: [birth year], 76 y.o.   MRN: [id number]  HPI Nichole Hunt, a 76 year old female with a history of prediabetes and chronic bronchitis presents to establish care. Nichole Hunt recently relocated from [place], [place]. She was a patient of Dr. Bennett Scrape at [facility] in [place], [place]. She was recently admitted to inpatient services for influenza B. Patient was referred to pulmonology following inpatient admission. She is concerned about having COPD. She says that cough is worsening and she has had periodic fevers and night sweats. She says that cough is persistent and productive. Cough is unrelieved by rescue inhaler and duonebulizer. She says that cough is keeping her up at night. She endorses fever, fatigue, post nasal drip, and sinus pain. She denies chest pain and dyspnea on exertion.   Patient also has a history of prediabetes. She maintains that previous hemoglobin A1C was 6.2. She has not been active or following a low carbohydrate, low fat diet. She endorses weight loss. She denies polyuria, polyphagia, or polydipsia.  Immunization History  Administered Date(s) Administered  . Influenza-Unspecified 02/14/2016   Social History   Social History  . Marital status: Single    Spouse name: N/A  . Number of children: N/A  . Years of education: N/A   Occupational History  . Not on file.   Social History Main Topics  . Smoking status: Former Smoker    Packs/day: 3.00    Types: Cigarettes    Quit date: 04/15/1976  . Smokeless tobacco: Never Used  . Alcohol use No  . Drug use: No  . Sexual activity: No   Other Topics Concern  . Not on file   Social History Narrative  . No narrative on file    Review of Systems  Constitutional: Positive for fatigue and fever.  HENT: Positive for congestion, postnasal drip, sinus pain and sinus pressure. Negative for trouble swallowing.   Eyes: Negative for visual  disturbance.  Respiratory: Positive for cough and wheezing. Negative for chest tightness.   Cardiovascular: Negative.  Negative for chest pain, palpitations and leg swelling.  Gastrointestinal: Negative.   Endocrine: Negative for polydipsia, polyphagia and polyuria.  Genitourinary: Negative.   Musculoskeletal: Negative.   Skin: Negative.   Allergic/Immunologic: Negative.  Negative for immunocompromised state.  Neurological: Negative.   Psychiatric/Behavioral:       Anxiety        Objective:   Physical Exam  Constitutional: She is oriented to person, place, and time. She appears well-developed and well-nourished. She has a sickly appearance.  HENT:  Head: Normocephalic and atraumatic.  Right Ear: External ear normal.  Left Ear: External ear normal.  Nose: Rhinorrhea present.  Mouth/Throat: Oropharynx is clear and moist.  Allergic shiners  Eyes: Conjunctivae and EOM are normal. Pupils are equal, round, and reactive to light.  Neck: Normal range of motion. Neck supple.  Cardiovascular: Normal rate, regular rhythm, normal heart sounds and intact distal pulses.   Pulmonary/Chest: Effort normal. She has no decreased breath sounds. She has wheezes. She has no rhonchi.  Congested cough  Abdominal: Soft. Bowel sounds are normal.  Musculoskeletal: Normal range of motion.  Neurological: She is alert and oriented to person, place, and time. She has normal reflexes.  Skin: Skin is warm and dry. She is not diaphoretic. No pallor.  Psychiatric: Her behavior is normal. Judgment and thought content normal. Her mood appears anxious.  BP (!) 153/70 (BP Location: Left Arm, Patient Position: Sitting)   Pulse 88   Temp 98.3 F (36.8 C)   Resp 18   Ht 5\' 4"  (1.626 m)   Wt 181 lb (82.1 kg)   SpO2 95%   BMI 31.07 kg/m  Assessment & Plan:  1. Essential hypertension Blood pressure is above goal. Will start a trial of Amlodipine 5 mg daily. Will follow up in office in 1 month.  - COMPLETE  METABOLIC PANEL WITH GFR - POCT urinalysis dip (device) - amLODipine (NORVASC) 5 MG tablet; Take 1 tablet (5 mg total) by mouth daily.  Dispense: 90 tablet; Refill: 0  2. Prediabetes Recommend a lowfat, low carbohydrate diet divided over 5-6 small meals, increase water intake to 6-8 glasses, and 150 minutes per week of cardiovascular exercise.   - POCT A1C - metFORMIN (GLUCOPHAGE) 500 MG tablet; Take 1 tablet (500 mg total) by mouth 2 (two) times daily with a meal.  Dispense: 180 tablet; Refill: 1 - Ambulatory referral to diabetic education  3. Persistent cough for 3 weeks or longer  - Promethazine HCl 6.25 MG/5ML SOLN; Take 5 mLs (6.25 mg total) by mouth every 6 (six) hours as needed.  Dispense: 240 mL; Refill: 0  4. Fever and chills Recommend OTC Tylenol 500 mg every 6 hours as needed for temp greater than 100.6.   5. Acute upper respiratory infection Increase fluid intake, rest, and handwashing.  - DG Chest 2 View; Future - amoxicillin-clavulanate (AUGMENTIN) 875-125 MG tablet; Take 1 tablet by mouth 2 (two) times daily.  Dispense: 20 tablet; Refill: 0    RTC: 1 month for hypertension and prediabetes   The patient was given clear instructions to go to ER or return to medical center if symptoms do not improve, worsen or new problems develop. The patient verbalized understanding. Will notify patient with laboratory results.     Massie MaroonHollis,Lachina M, FNP

## 2016-05-09 ENCOUNTER — Telehealth: Payer: Self-pay | Admitting: Pulmonary Disease

## 2016-05-09 NOTE — Telephone Encounter (Signed)
Spoke with pt and her daughter-in-law, Mykel. They are aware of PM's recommendations. Nothing further was needed.

## 2016-05-09 NOTE — Telephone Encounter (Signed)
Let her know that the CXR does not show any acute changes. Ask her to continue the levaquin and prednisone. If there is worsening then she may need to go to ED.

## 2016-05-09 NOTE — Telephone Encounter (Signed)
Pt is requesting CXR results from 05/08/16. Pt is also requesting an apt for tomorrow. I have advised pt that we do not have any open slots for tomorrow. Pt states she coughed up a little blood yesterday, chest discomfort, wheezing & sweats X 5d  PM please advise. Thanks.

## 2016-05-10 ENCOUNTER — Inpatient Hospital Stay (HOSPITAL_COMMUNITY)
Admission: EM | Admit: 2016-05-10 | Discharge: 2016-05-14 | DRG: 190 | Disposition: A | Discharge status: Home / Self Care | Payer: Medicare HMO | Attending: Family Medicine | Admitting: Family Medicine

## 2016-05-10 ENCOUNTER — Encounter (HOSPITAL_COMMUNITY): Payer: Self-pay | Admitting: *Deleted

## 2016-05-10 ENCOUNTER — Emergency Department (HOSPITAL_COMMUNITY): Payer: Medicare HMO

## 2016-05-10 DIAGNOSIS — Z8249 Family history of ischemic heart disease and other diseases of the circulatory system: Secondary | ICD-10-CM

## 2016-05-10 DIAGNOSIS — Z823 Family history of stroke: Secondary | ICD-10-CM

## 2016-05-10 DIAGNOSIS — K449 Diaphragmatic hernia without obstruction or gangrene: Secondary | ICD-10-CM | POA: Diagnosis present

## 2016-05-10 DIAGNOSIS — R7303 Prediabetes: Secondary | ICD-10-CM | POA: Diagnosis present

## 2016-05-10 DIAGNOSIS — J449 Chronic obstructive pulmonary disease, unspecified: Secondary | ICD-10-CM

## 2016-05-10 DIAGNOSIS — F4310 Post-traumatic stress disorder, unspecified: Secondary | ICD-10-CM | POA: Diagnosis present

## 2016-05-10 DIAGNOSIS — Z811 Family history of alcohol abuse and dependence: Secondary | ICD-10-CM

## 2016-05-10 DIAGNOSIS — Z7952 Long term (current) use of systemic steroids: Secondary | ICD-10-CM

## 2016-05-10 DIAGNOSIS — R1310 Dysphagia, unspecified: Secondary | ICD-10-CM | POA: Diagnosis present

## 2016-05-10 DIAGNOSIS — J440 Chronic obstructive pulmonary disease with acute lower respiratory infection: Secondary | ICD-10-CM | POA: Diagnosis present

## 2016-05-10 DIAGNOSIS — Z79899 Other long term (current) drug therapy: Secondary | ICD-10-CM

## 2016-05-10 DIAGNOSIS — N179 Acute kidney failure, unspecified: Secondary | ICD-10-CM

## 2016-05-10 DIAGNOSIS — R0602 Shortness of breath: Secondary | ICD-10-CM | POA: Diagnosis not present

## 2016-05-10 DIAGNOSIS — Z7951 Long term (current) use of inhaled steroids: Secondary | ICD-10-CM

## 2016-05-10 DIAGNOSIS — J441 Chronic obstructive pulmonary disease with (acute) exacerbation: Secondary | ICD-10-CM | POA: Diagnosis not present

## 2016-05-10 DIAGNOSIS — R69 Illness, unspecified: Secondary | ICD-10-CM

## 2016-05-10 DIAGNOSIS — G4700 Insomnia, unspecified: Secondary | ICD-10-CM | POA: Diagnosis present

## 2016-05-10 DIAGNOSIS — R112 Nausea with vomiting, unspecified: Secondary | ICD-10-CM | POA: Diagnosis present

## 2016-05-10 DIAGNOSIS — Z7984 Long term (current) use of oral hypoglycemic drugs: Secondary | ICD-10-CM

## 2016-05-10 DIAGNOSIS — K219 Gastro-esophageal reflux disease without esophagitis: Secondary | ICD-10-CM | POA: Diagnosis present

## 2016-05-10 DIAGNOSIS — I10 Essential (primary) hypertension: Secondary | ICD-10-CM | POA: Diagnosis present

## 2016-05-10 DIAGNOSIS — Y95 Nosocomial condition: Secondary | ICD-10-CM | POA: Diagnosis present

## 2016-05-10 DIAGNOSIS — R0902 Hypoxemia: Secondary | ICD-10-CM | POA: Diagnosis present

## 2016-05-10 DIAGNOSIS — E860 Dehydration: Secondary | ICD-10-CM

## 2016-05-10 DIAGNOSIS — J189 Pneumonia, unspecified organism: Secondary | ICD-10-CM | POA: Diagnosis present

## 2016-05-10 DIAGNOSIS — Z87891 Personal history of nicotine dependence: Secondary | ICD-10-CM

## 2016-05-10 DIAGNOSIS — Z833 Family history of diabetes mellitus: Secondary | ICD-10-CM

## 2016-05-10 DIAGNOSIS — J111 Influenza due to unidentified influenza virus with other respiratory manifestations: Secondary | ICD-10-CM

## 2016-05-10 HISTORY — DX: Cough: R05

## 2016-05-10 HISTORY — DX: Chronic cough: R05.3

## 2016-05-10 HISTORY — DX: Influenza due to other identified influenza virus with other respiratory manifestations: J10.1

## 2016-05-10 LAB — COMPREHENSIVE METABOLIC PANEL
ALT: 25 U/L (ref 14–54)
AST: 26 U/L (ref 15–41)
Albumin: 3.4 g/dL — ABNORMAL LOW (ref 3.5–5.0)
Alkaline Phosphatase: 50 U/L (ref 38–126)
Anion gap: 11 (ref 5–15)
BILIRUBIN TOTAL: 0.7 mg/dL (ref 0.3–1.2)
BUN: 10 mg/dL (ref 6–20)
CALCIUM: 9 mg/dL (ref 8.9–10.3)
CO2: 21 mmol/L — ABNORMAL LOW (ref 22–32)
CREATININE: 1.05 mg/dL — AB (ref 0.44–1.00)
Chloride: 106 mmol/L (ref 101–111)
GFR calc Af Amer: 60 mL/min — ABNORMAL LOW (ref >60)
GFR, EST NON AFRICAN AMERICAN: 51 mL/min — AB (ref >60)
Glucose, Bld: 124 mg/dL — ABNORMAL HIGH (ref 65–99)
Potassium: 3.9 mmol/L (ref 3.5–5.1)
Sodium: 138 mmol/L (ref 135–145)
TOTAL PROTEIN: 6.6 g/dL (ref 6.5–8.1)

## 2016-05-10 LAB — URINALYSIS, ROUTINE W REFLEX MICROSCOPIC
BILIRUBIN URINE: NEGATIVE
Bacteria, UA: NONE SEEN
Glucose, UA: NEGATIVE mg/dL
Ketones, ur: 20 mg/dL — AB
NITRITE: NEGATIVE
Protein, ur: NEGATIVE mg/dL
pH: 5 (ref 5.0–8.0)

## 2016-05-10 LAB — CBC
HCT: 38.4 % (ref 36.0–46.0)
Hemoglobin: 13 g/dL (ref 12.0–15.0)
MCH: 30.8 pg (ref 26.0–34.0)
MCHC: 33.9 g/dL (ref 30.0–36.0)
MCV: 91 fL (ref 78.0–100.0)
PLATELETS: 202 10*3/uL (ref 150–400)
RBC: 4.22 MIL/uL (ref 3.87–5.11)
RDW: 13.5 % (ref 11.5–15.5)
WBC: 10.3 10*3/uL (ref 4.0–10.5)

## 2016-05-10 LAB — PROCALCITONIN: Procalcitonin: 0.41 ng/mL

## 2016-05-10 LAB — SEDIMENTATION RATE: SED RATE: 21 mm/h (ref 0–22)

## 2016-05-10 LAB — INFLUENZA PANEL BY PCR (TYPE A & B)
INFLAPCR: NEGATIVE
INFLBPCR: NEGATIVE

## 2016-05-10 LAB — I-STAT CG4 LACTIC ACID, ED
Lactic Acid, Venous: 1.08 mmol/L (ref 0.5–1.9)
Lactic Acid, Venous: 0.42 mmol/L — ABNORMAL LOW (ref 0.5–1.9)

## 2016-05-10 LAB — TROPONIN I: Troponin I: <0.03 ng/mL (ref <0.03)

## 2016-05-10 LAB — LIPASE, BLOOD: Lipase: 22 U/L (ref 11–51)

## 2016-05-10 LAB — BRAIN NATRIURETIC PEPTIDE: B Natriuretic Peptide: 42.9 pg/mL (ref 0.0–100.0)

## 2016-05-10 MED ORDER — ENOXAPARIN SODIUM 40 MG/0.4ML ~~LOC~~ SOLN
40.0000 mg | SUBCUTANEOUS | Status: DC
  Administered 2016-05-11 – 2016-05-14 (×4): 40 mg via SUBCUTANEOUS
  Filled 2016-05-10 (×4): qty 0.4

## 2016-05-10 MED ORDER — PRAZOSIN HCL 2 MG PO CAPS
2.0000 mg | ORAL_CAPSULE | Freq: Every day | ORAL | Status: DC
  Administered 2016-05-10 – 2016-05-13 (×4): 2 mg via ORAL
  Filled 2016-05-10 (×4): qty 1

## 2016-05-10 MED ORDER — PIPERACILLIN-TAZOBACTAM 3.375 G IVPB
3.3750 g | Freq: Three times a day (TID) | INTRAVENOUS | Status: AC
  Administered 2016-05-10 – 2016-05-12 (×7): 3.375 g via INTRAVENOUS
  Filled 2016-05-10 (×7): qty 50

## 2016-05-10 MED ORDER — IPRATROPIUM-ALBUTEROL 0.5-2.5 (3) MG/3ML IN SOLN
3.0000 mL | RESPIRATORY_TRACT | Status: DC
  Administered 2016-05-10: 3 mL via RESPIRATORY_TRACT

## 2016-05-10 MED ORDER — ONDANSETRON 4 MG PO TBDP
4.0000 mg | ORAL_TABLET | Freq: Once | ORAL | Status: AC | PRN
  Administered 2016-05-10: 4 mg via ORAL

## 2016-05-10 MED ORDER — VANCOMYCIN HCL IN DEXTROSE 750-5 MG/150ML-% IV SOLN
750.0000 mg | Freq: Two times a day (BID) | INTRAVENOUS | Status: AC
  Administered 2016-05-11 – 2016-05-12 (×4): 750 mg via INTRAVENOUS
  Filled 2016-05-10 (×4): qty 150

## 2016-05-10 MED ORDER — SODIUM CHLORIDE 0.9 % IV SOLN
Freq: Once | INTRAVENOUS | Status: AC
  Administered 2016-05-10: 17:00:00 via INTRAVENOUS

## 2016-05-10 MED ORDER — IPRATROPIUM-ALBUTEROL 0.5-2.5 (3) MG/3ML IN SOLN
3.0000 mL | Freq: Once | RESPIRATORY_TRACT | Status: AC
  Administered 2016-05-10: 3 mL via RESPIRATORY_TRACT
  Filled 2016-05-10: qty 3

## 2016-05-10 MED ORDER — VANCOMYCIN HCL IN DEXTROSE 1-5 GM/200ML-% IV SOLN
1000.0000 mg | Freq: Once | INTRAVENOUS | Status: AC
  Administered 2016-05-10: 1000 mg via INTRAVENOUS
  Filled 2016-05-10: qty 200

## 2016-05-10 MED ORDER — PANTOPRAZOLE SODIUM 40 MG PO TBEC
40.0000 mg | DELAYED_RELEASE_TABLET | Freq: Every day | ORAL | Status: DC
  Administered 2016-05-11 – 2016-05-14 (×4): 40 mg via ORAL
  Filled 2016-05-10 (×4): qty 1

## 2016-05-10 MED ORDER — IOPAMIDOL (ISOVUE-370) INJECTION 76%
INTRAVENOUS | Status: AC
  Administered 2016-05-10: 100 mL
  Filled 2016-05-10: qty 100

## 2016-05-10 MED ORDER — SODIUM CHLORIDE 0.9 % IV SOLN
INTRAVENOUS | Status: AC
  Administered 2016-05-10: 22:00:00 via INTRAVENOUS

## 2016-05-10 MED ORDER — TRAZODONE HCL 50 MG PO TABS
50.0000 mg | ORAL_TABLET | Freq: Every day | ORAL | Status: DC
  Administered 2016-05-10 – 2016-05-13 (×4): 50 mg via ORAL
  Filled 2016-05-10 (×4): qty 1

## 2016-05-10 MED ORDER — PREDNISONE 50 MG PO TABS
50.0000 mg | ORAL_TABLET | Freq: Every day | ORAL | Status: DC
  Administered 2016-05-11 – 2016-05-14 (×4): 50 mg via ORAL
  Filled 2016-05-10 (×4): qty 1

## 2016-05-10 MED ORDER — PREDNISOLONE 5 MG PO TABS: 50.0000 mg | ORAL_TABLET | Freq: Every day | ORAL | Status: DC

## 2016-05-10 MED ORDER — IPRATROPIUM-ALBUTEROL 0.5-2.5 (3) MG/3ML IN SOLN
3.0000 mL | Freq: Two times a day (BID) | RESPIRATORY_TRACT | Status: DC
  Administered 2016-05-11: 3 mL via RESPIRATORY_TRACT
  Filled 2016-05-10: qty 3

## 2016-05-10 MED ORDER — INSULIN ASPART 100 UNIT/ML ~~LOC~~ SOLN
0.0000 [IU] | Freq: Three times a day (TID) | SUBCUTANEOUS | Status: DC
  Administered 2016-05-11: 1 [IU] via SUBCUTANEOUS
  Administered 2016-05-11: 3 [IU] via SUBCUTANEOUS
  Administered 2016-05-12: 1 [IU] via SUBCUTANEOUS
  Administered 2016-05-12: 2 [IU] via SUBCUTANEOUS
  Administered 2016-05-13 – 2016-05-14 (×3): 1 [IU] via SUBCUTANEOUS

## 2016-05-10 MED ORDER — AMLODIPINE BESYLATE 5 MG PO TABS
5.0000 mg | ORAL_TABLET | Freq: Every day | ORAL | Status: DC
  Administered 2016-05-11 – 2016-05-14 (×4): 5 mg via ORAL
  Filled 2016-05-10 (×4): qty 1

## 2016-05-10 MED ORDER — ATORVASTATIN CALCIUM 10 MG PO TABS
10.0000 mg | ORAL_TABLET | Freq: Every day | ORAL | Status: DC
  Administered 2016-05-10 – 2016-05-13 (×4): 10 mg via ORAL
  Filled 2016-05-10 (×4): qty 1

## 2016-05-10 MED ORDER — SODIUM CHLORIDE 0.9 % IV BOLUS (SEPSIS)
500.0000 mL | Freq: Once | INTRAVENOUS | Status: AC
Start: 2016-05-10 — End: 2016-05-10
  Administered 2016-05-10: 500 mL via INTRAVENOUS

## 2016-05-10 MED ORDER — CITALOPRAM HYDROBROMIDE 10 MG PO TABS
20.0000 mg | ORAL_TABLET | Freq: Every day | ORAL | Status: DC
  Administered 2016-05-11 – 2016-05-14 (×4): 20 mg via ORAL
  Filled 2016-05-10 (×4): qty 2

## 2016-05-10 MED ORDER — HYDROCODONE-HOMATROPINE 5-1.5 MG/5ML PO SYRP
10.0000 mL | ORAL_SOLUTION | Freq: Once | ORAL | Status: AC
  Administered 2016-05-10: 10 mL via ORAL
  Filled 2016-05-10: qty 10

## 2016-05-10 MED ORDER — ONDANSETRON 4 MG PO TBDP
ORAL_TABLET | ORAL | Status: AC
  Filled 2016-05-10: qty 1

## 2016-05-10 MED ORDER — DEXTROSE 5 % IV SOLN
1.0000 g | Freq: Once | INTRAVENOUS | Status: DC
  Filled 2016-05-10: qty 1

## 2016-05-10 MED ORDER — ACETAMINOPHEN 325 MG PO TABS
650.0000 mg | ORAL_TABLET | Freq: Four times a day (QID) | ORAL | Status: DC | PRN
  Administered 2016-05-11 (×2): 650 mg via ORAL
  Filled 2016-05-10 (×2): qty 2

## 2016-05-10 MED ORDER — MOMETASONE FURO-FORMOTEROL FUM 200-5 MCG/ACT IN AERO
2.0000 | INHALATION_SPRAY | Freq: Two times a day (BID) | RESPIRATORY_TRACT | Status: DC
  Administered 2016-05-11 – 2016-05-13 (×6): 2 via RESPIRATORY_TRACT
  Filled 2016-05-10: qty 8.8

## 2016-05-10 MED ORDER — GUAIFENESIN ER 600 MG PO TB12
1200.0000 mg | ORAL_TABLET | Freq: Two times a day (BID) | ORAL | Status: DC | PRN
  Administered 2016-05-10 – 2016-05-11 (×2): 1200 mg via ORAL
  Filled 2016-05-10 (×2): qty 2

## 2016-05-10 MED ORDER — ONDANSETRON HCL 4 MG/2ML IJ SOLN
4.0000 mg | Freq: Once | INTRAMUSCULAR | Status: AC
  Administered 2016-05-10: 4 mg via INTRAVENOUS
  Filled 2016-05-10: qty 2

## 2016-05-10 MED ORDER — ALBUTEROL SULFATE (2.5 MG/3ML) 0.083% IN NEBU: 2.5000 mg | INHALATION_SOLUTION | Freq: Four times a day (QID) | RESPIRATORY_TRACT | Status: DC | PRN

## 2016-05-10 MED ORDER — ACETAMINOPHEN 650 MG RE SUPP: 650.0000 mg | Freq: Four times a day (QID) | RECTAL | Status: DC | PRN

## 2016-05-10 MED ORDER — ACETAMINOPHEN 500 MG PO TABS
1000.0000 mg | ORAL_TABLET | Freq: Once | ORAL | Status: AC
  Administered 2016-05-10: 1000 mg via ORAL
  Filled 2016-05-10: qty 2

## 2016-05-10 NOTE — ED Notes (Signed)
Pt states feeling weak when standing. Denies nausea/dizziness.

## 2016-05-10 NOTE — ED Triage Notes (Addendum)
Pt and family member reports ongoing cough and flu symptoms, diagnosed with flu on 1/2. Reports still feeling bad and now has n/v/d today and chills. Pt appears uncomfortable, pale and weak at triage, is moaning, requesting to lay down and has taken her shirt off at triage.

## 2016-05-10 NOTE — ED Notes (Signed)
PT C/O severe nausea since getting back from CT

## 2016-05-10 NOTE — ED Provider Notes (Signed)
[facility]-EMERGENCY DEPT Provider Note   CSN: [id number] Arrival date & time: 05/10/16  1320     History   Chief Complaint Chief Complaint  Patient presents with  . Influenza  . Emesis    HPI Nichole Hunt is a 76 y.o. female.  HPI Patient reports continuous and harsh coughing episodes. She reports she has wheezing. She reports occasionally a little speck of blood about the size of a pinhead will come up when she is coughing. She reports she can't bring up any mucus. She states that the harsh coughing makes her chest hurt and makes her feel short of breath. Patient has been diagnosed with COPD and has as needed home oxygen and nebulizer treatments to use. She reports she got diagnosed with influenza earlier in the month and has also been diagnosed with COPD exacerbation. She has taken a full course of doxycycline and now has started Levaquin 2 days ago. She reports sometimes the symptoms improve a bit but then they come back again. She states this has been continuous for months now (actually she reports since 2016 she has been having recurrent bouts of COPD or pneumonia). Today she started having some vomiting as well. Some of it is after harsh coughing but other has been without coughing. 4 episodes today. Waxing waning fever. Subjective to about 100 or so. Patient is scheduled to follow up with pulmonology for more pulmonary function testing and evaluation. Past Medical History:  Diagnosis Date  . Diverticulitis   . Hypertension   . Influenza B   . Insomnia   . Major depression   . Vertigo     Patient Active Problem List   Diagnosis Date Noted  . HCAP (healthcare-associated pneumonia) 05/10/2016  . Persistent cough for 3 weeks or longer 05/08/2016  . Essential hypertension 05/08/2016  . Prediabetes 05/08/2016  . Hypoxia 04/16/2016  . Hypokalemia 04/16/2016  . PTSD (post-traumatic stress disorder) 04/16/2016  . COPD exacerbation (HCC) 04/15/2016    Past Surgical History:    Procedure Laterality Date  . COLONOSCOPY    . EYE SURGERY      OB History    No data available       Home Medications    Prior to Admission medications   Medication Sig Start Date End Date Taking? Authorizing Provider  albuterol (PROVENTIL HFA;VENTOLIN HFA) 108 (90 Base) MCG/ACT inhaler Inhale 1-2 puffs into the lungs every 6 (six) hours as needed for wheezing or shortness of breath.    Yes Historical Provider, MD  amLODipine (NORVASC) 5 MG tablet Take 1 tablet (5 mg total) by mouth daily. 05/08/16  Yes Massie Maroon, FNP  atorvastatin (LIPITOR) 10 MG tablet Take 10 mg by mouth at bedtime.    Yes Historical Provider, MD  calcium carbonate (TUMS - DOSED IN MG ELEMENTAL CALCIUM) 500 MG chewable tablet Chew 1-2 tablets by mouth as needed for indigestion or heartburn.    Yes Historical Provider, MD  citalopram (CELEXA) 20 MG tablet Take 20 mg by mouth daily.   Yes Historical Provider, MD  fluticasone (FLOVENT HFA) 220 MCG/ACT inhaler Inhale 2 puffs into the lungs 2 (two) times daily.   Yes Historical Provider, MD  guaiFENesin (MUCINEX) 600 MG 12 hr tablet Take 1,200 mg by mouth 2 (two) times daily as needed for cough or to loosen phlegm.   Yes Historical Provider, MD  ipratropium-albuterol (DUONEB) 0.5-2.5 (3) MG/3ML SOLN Take 3 mLs by nebulization every 6 (six) hours as needed (for wheezing/shortness of breath).  Yes Historical Provider, MD  levofloxacin (LEVAQUIN) 750 MG tablet Take 1 tablet (750 mg total) by mouth daily. 05/08/16  Yes Praveen Mannam, MD  metFORMIN (GLUCOPHAGE) 500 MG tablet Take 1 tablet (500 mg total) by mouth 2 (two) times daily with a meal. 05/08/16  Yes Massie Maroon, FNP  pantoprazole (PROTONIX) 40 MG tablet Take 1 tablet (40 mg total) by mouth daily. 04/20/16  Yes Leana Roe Elgergawy, MD  prazosin (MINIPRESS) 2 MG capsule Take 2 mg by mouth at bedtime.   Yes Historical Provider, MD  traZODone (DESYREL) 50 MG tablet Take 50 mg by mouth at bedtime.   Yes Historical  Provider, MD    Family History Family History  Problem Relation Age of Onset  . Alcoholism Mother   . Alcoholism Father   . Heart attack Father   . Diabetes Sister   . Heart attack Sister   . Hypertension Sister   . Stroke Sister     Social History Social History  Substance Use Topics  . Smoking status: Former Smoker    Packs/day: 3.00    Types: Cigarettes    Quit date: 04/15/1976  . Smokeless tobacco: Never Used  . Alcohol use No     Allergies   Patient has no known allergies.   Review of Systems Review of Systems 10 Systems reviewed and are negative for acute change except as noted in the HPI.  Physical Exam Updated Vital Signs BP 109/64   Pulse 83   Temp 99.4 F (37.4 C) (Oral)   Resp 17   SpO2 90%   Physical Exam  Constitutional: She appears well-developed and well-nourished. No distress.  Patient is nontoxic and alert. She has intermittent harsh cough paroxysmal but does not have respiratory distress at rest.  HENT:  Head: Normocephalic and atraumatic.  Nose: Nose normal.  Mouth/Throat: Oropharynx is clear and moist.  Eyes: Conjunctivae and EOM are normal.  Neck: Neck supple.  Cardiovascular: Normal rate and regular rhythm.   No murmur heard. Pulmonary/Chest: Effort normal. No respiratory distress.  With forced expiration patient does have bilateral expiratory wheeze. Adequate air flow to the bases. No rhonchi.  Abdominal: Soft. She exhibits no distension. There is no tenderness.  Mild suprapubic discomfort to palpation with no mass or fullness.  Musculoskeletal: She exhibits no edema, tenderness or deformity.  Calves are nontender patient does not have any peripheral edema. Skin condition is good.  Neurological: She is alert.  Skin: Skin is warm and dry.  Psychiatric: She has a normal mood and affect.  Nursing note and vitals reviewed.    ED Treatments / Results  Labs (all labs ordered are listed, but only abnormal results are displayed) Labs  Reviewed  COMPREHENSIVE METABOLIC PANEL - Abnormal; Notable for the following:       Result Value   CO2 21 (*)    Glucose, Bld 124 (*)    Creatinine, Ser 1.05 (*)    Albumin 3.4 (*)    GFR calc non Af Amer 51 (*)    GFR calc Af Amer 60 (*)    All other components within normal limits  I-STAT CG4 LACTIC ACID, ED - Abnormal; Notable for the following:    Lactic Acid, Venous 0.42 (*)    All other components within normal limits  LIPASE, BLOOD  CBC  SEDIMENTATION RATE  URINALYSIS, ROUTINE W REFLEX MICROSCOPIC  INFLUENZA PANEL BY PCR (TYPE A & B)  I-STAT CG4 LACTIC ACID, ED    EKG  EKG  Interpretation None       Radiology Ct Angio Chest Pe W And/or Wo Contrast  Result Date: 05/10/2016 CLINICAL DATA:  Subacute onset of cough, shortness of breath, vomiting and hemoptysis. Initial encounter. EXAM: CT ANGIOGRAPHY CHEST WITH CONTRAST TECHNIQUE: Multidetector CT imaging of the chest was performed using the standard protocol during bolus administration of intravenous contrast. Multiplanar CT image reconstructions and MIPs were obtained to evaluate the vascular anatomy. CONTRAST:  100 mL of Isovue 370 IV contrast COMPARISON:  Chest radiograph performed 05/08/2016 FINDINGS: Cardiovascular:  There is no evidence of pulmonary embolus. The heart is borderline normal in size. Minimal calcification is seen along the aortic arch and descending thoracic aorta. The great vessels are unremarkable in appearance. Mediastinum/Nodes: Trace pericardial fluid likely remains within normal limits. No mediastinal lymphadenopathy is seen. The thyroid gland is unremarkable. No axillary lymphadenopathy is appreciated. Lungs/Pleura: Hazy airspace opacification is noted within the left lower lobe, compatible with pneumonia. Additional minimal opacities are seen within the right lower lobe and left upper lobe. The lungs are otherwise grossly clear. No pleural effusion or pneumothorax is seen. No dominant mass is identified.  Upper Abdomen: The visualized portions of the liver and spleen are unremarkable. A small hiatal hernia is noted. Musculoskeletal: No acute osseous abnormalities are identified. Degenerative change is noted along the lower cervical spine. The visualized musculature is unremarkable in appearance. Review of the MIP images confirms the above findings. IMPRESSION: 1. No evidence of pulmonary embolus. 2. Hazy airspace opacification within the left lower lung lobe, compatible with pneumonia. Additional minimal airspace opacities within the right lower lobe and left upper lobe. 3. Small hiatal hernia noted. Electronically Signed   By: Roanna RaiderJeffery  Chang M.D.   On: 05/10/2016 18:17    Procedures Procedures (including critical care time)  Medications Ordered in ED Medications  ondansetron (ZOFRAN-ODT) 4 MG disintegrating tablet (not administered)  ceFEPIme (MAXIPIME) 1 g in dextrose 5 % 50 mL IVPB (not administered)  vancomycin (VANCOCIN) IVPB 1000 mg/200 mL premix (1,000 mg Intravenous New Bag/Given 05/10/16 1856)  ondansetron (ZOFRAN-ODT) disintegrating tablet 4 mg (4 mg Oral Given 05/10/16 1339)  sodium chloride 0.9 % bolus 500 mL (0 mLs Intravenous Stopped 05/10/16 1711)  0.9 %  sodium chloride infusion ( Intravenous New Bag/Given 05/10/16 1640)  acetaminophen (TYLENOL) tablet 1,000 mg (1,000 mg Oral Given 05/10/16 1639)  ipratropium-albuterol (DUONEB) 0.5-2.5 (3) MG/3ML nebulizer solution 3 mL (3 mLs Nebulization Given 05/10/16 1639)  HYDROcodone-homatropine (HYCODAN) 5-1.5 MG/5ML syrup 10 mL (10 mLs Oral Given 05/10/16 1711)  iopamidol (ISOVUE-370) 76 % injection (100 mLs  Contrast Given 05/10/16 1744)  ondansetron (ZOFRAN) injection 4 mg (4 mg Intravenous Given 05/10/16 1813)     Initial Impression / Assessment and Plan / ED Course  I have reviewed the triage vital signs and the nursing notes.  Pertinent labs & imaging results that were available during my care of the patient were reviewed by me and  considered in my medical decision making (see chart for details).     Consult: Family practice resident for admission. Accepting Dr. Lum BabeEniola  Final Clinical Impressions(s) / ED Diagnoses   Final diagnoses:  HCAP (healthcare-associated pneumonia)  Influenza-like illness  Chronic obstructive pulmonary disease, unspecified COPD type (HCC)   Patient has recurrence of cough with fever. She is also developed some vomiting. Patient has had hypoxia to 90% on room air. CT chest exhibits patchy infiltrate consistent with pneumonia. Patient was hospitalized within the last month for influenza and COPD exacerbation. She thus  has criteria for HCAP, cefepime and vancomycin initiated in the emergency department. New Prescriptions New Prescriptions   No medications on file     Arby Barrette, MD 05/10/16 1905

## 2016-05-10 NOTE — Progress Notes (Signed)
Pharmacy Antibiotic Note  Nichole Hunt is a 76 y.o. female admitted on 05/10/2016 with HCAP.  Pharmacy has been consulted for vancomycin and zosyn dosing. Renal function wnl. Received 1g vancomycin in the ED at 1956.  Vancomycin trough goal 15-20  Plan: 1) Vancomycin 750mg  IV q12 2) Zosyn 3.375g IV q8 (4 hour infusion) 3) Follow renal function, cultures, LOT, level as needed     Temp (24hrs), Avg:99.2 F (37.3 C), Min:98.2 F (36.8 C), Max:100.1 F (37.8 C)   Recent Labs Lab 05/08/16 1110 05/10/16 1326 05/10/16 1354 05/10/16 1732  WBC  --  10.3  --   --   CREATININE 1.06* 1.05*  --   --   LATICACIDVEN  --   --  1.08 0.42*    Estimated Creatinine Clearance: 49.5 mL/min (by C-G formula based on SCr of 1.05 mg/dL (H)).    No Known Allergies  Antimicrobials this admission: 1/26 Vancomycin>> 1/26 Zosyn>>  Dose adjustments this admission: n/a  Microbiology results: N/a  Thank you for allowing pharmacy to be a part of this patient's care.  Fredrik RiggerMarkle, Nadiah Corbit Sue 05/10/2016 8:21 PM

## 2016-05-10 NOTE — H&P (Signed)
Family Medicine Teaching [facility] Admission History and Physical Service Pager: [phone]  Patient name: [name] Medical record number: [id number] Date of birth: 02-02-1941 Age: 76 y.o. Gender: female  Primary Care Provider: Massie Maroon, FNP Consultants: none Code Status: full (obtained on admission)  Chief Complaint: Cough  Assessment and Plan: Nichole Hunt is a 76 y.o. female presenting with cough. PMH is significant for COPD, prediabetes, hypertension, PTSD  Cough/dyspnea: likely due to COPD exacerbation in the setting of pneumonia. She has some hemoptysis but PE was ruled out by CTA chest. No signs of fluid overload but cannot rule out CHF although she had no cardiac history. Lung exam with rhonchi and expiratory wheeze bilaterally suggestive for COPD exacerbation. She denies exertional chest pain to think of ACS. HEART score, not including EKG and trop is 3 (for age and HTN). GERD is another possibility. CT chest showed small hiatal hernia.  -Admit to MedSurg for observation. Attending Dr. Lum Babe -COPD as below -Pneumonia as below -Procalcitonin -Troponin and EKG to rule out ACS -BMP to rule out CHF -Pantoprazole 40 mg daily -Incentive spirometry -Continue home Mucinex -Oxygen as needed to keep saturation greater than 90%  COPD class D: with exacerbation. Second admission in a month. High CAT score. On DuoNeb nebulizer, Symbicort at home. Exam with rhonchorous and expiratory wheeze bilaterally. No increased work of breathing. Wondering if GERD is playing a role here.  -Prednisone 50 mg daily -Antibiotic as below -Dulera daily -DuoNeb nebulizer every 6 hours -Protonix as above -Route HPI to Dr. Isaiah Serge  HCAP:  CTA chest with LLL pneumonia, RLL and LUL opacities concerning for pneumonia. PSI 78 including subjective fever. Given recent hospitalizations, recent antibiotics (Levaquin 2 days ago by her pulmonologist), and underlying COPD, will treat with  broad-spectrum antibiotic as below. -Continue vancomycin -Switch cefepime to Zosyn for anaerobic coverage -Discussed the atypical coverage in the morning -Procalcitonin -Incentive spirometry  Hypertension: Normotensive on admission.  -Continue amlodipine  PTSD/Insomnia: Citalopram, trazodone and prazosin at home -Continue home medications  Prediabetes: Last A1c 6.3 three weeks ago. On metformin at home -SSI-thin -Old home metformin  FEN/GI:  -Car modified diet -Protonix as above  Prophylaxis:  -Lovenox  Disposition: Admit to MedSurg for pneumonia/COPD exacerbation  History of Present Illness:  Nichole Hunt is a 76 y.o. female presenting with cough, dyspnea, shaking, vomiting and diarrhea History was provided by the patient with contribution from daughter-in-law and granddaughter who are at bedside. Patient reports having cough and dyspnea for months. She stated that her cough was worse this morning. Cough was productive with blood-tinged phlegm. Cough was associated with chest pain. Cough is worse at night when she sleeps. She also reports shaking, vomiting, diarrhea, wheezing and fever to 101 at home. Emesis and diarrhea and nonbloody. She reports calling her PCP and pulmonologist, Dr. Isaiah Serge who advised her to go to ED.   Patient is on DuoNeb and Symbicort at home.   On chart review, patient was recently hospitalized from 04/15/2016-04/19/2016 for COPD exacerbation in the setting of influenza.  About 2 days ago, she was started on Levaquin and steroid by her pulmonologist. Chest x-ray was negative.  In ED, vital signs was within normal limits. Oxygen saturation 98% on room air on arrival to ED. She had one episode of desaturation to 88% that has resolved without oxygen. CMP, CBC and lactic acid within normal limits. UA not convincing for UTI. CTA negative for PE but showed LLL pneumonia, RLL and LUL opacities concerning for pneumonia and small  hiatal hernia. Influenza was negative.  She was given DuoNeb and Vancomycin. Changed cefepime to Zosyn on admission.   Review Of Systems:  Review of Systems  Constitutional: Positive for chills and fever.  HENT: Positive for sore throat. Negative for congestion.   Respiratory: Positive for cough, hemoptysis, sputum production, shortness of breath and wheezing.   Cardiovascular: Negative for chest pain, palpitations and leg swelling.  Gastrointestinal: Positive for diarrhea. Negative for blood in stool, melena, nausea and vomiting.  Genitourinary: Negative for dysuria and hematuria.  Musculoskeletal: Negative for myalgias.  Skin: Negative for rash.  Neurological: Positive for dizziness, weakness and headaches. Negative for sensory change, speech change and focal weakness.  Endo/Heme/Allergies: Does not bruise/bleed easily.  Psychiatric/Behavioral: Negative for depression, substance abuse and suicidal ideas. The patient is not nervous/anxious.     Patient Active Problem List   Diagnosis Date Noted  . HCAP (healthcare-associated pneumonia) 05/10/2016  . Pneumonia 05/10/2016  . Persistent cough for 3 weeks or longer 05/08/2016  . Essential hypertension 05/08/2016  . Prediabetes 05/08/2016  . Hypoxia 04/16/2016  . Hypokalemia 04/16/2016  . PTSD (post-traumatic stress disorder) 04/16/2016  . COPD exacerbation (HCC) 04/15/2016    Past Medical History: Past Medical History:  Diagnosis Date  . Diverticulitis   . Hypertension   . Influenza B   . Insomnia   . Major depression   . Vertigo     Past Surgical History: Past Surgical History:  Procedure Laterality Date  . COLONOSCOPY    . EYE SURGERY      Social History: Social History  Substance Use Topics  . Smoking status: Former Smoker    Packs/day: 3.00    Types: Cigarettes    Quit date: 04/15/1976  . Smokeless tobacco: Never Used  . Alcohol use No   Additional social history: lives with family. Denies smoking, alcohol use or recreational drug use  Please also  refer to relevant sections of EMR.  Family History: Family History  Problem Relation Age of Onset  . Alcoholism Mother   . Alcoholism Father   . Heart attack Father   . Diabetes Sister   . Heart attack Sister   . Hypertension Sister   . Stroke Sister    (If not completed, MUST add something in)  Allergies and Medications: No Known Allergies No current facility-administered medications on file prior to encounter.    Current Outpatient Prescriptions on File Prior to Encounter  Medication Sig Dispense Refill  . albuterol (PROVENTIL HFA;VENTOLIN HFA) 108 (90 Base) MCG/ACT inhaler Inhale 1-2 puffs into the lungs every 6 (six) hours as needed for wheezing or shortness of breath.     Marland Kitchen amLODipine (NORVASC) 5 MG tablet Take 1 tablet (5 mg total) by mouth daily. 90 tablet 0  . atorvastatin (LIPITOR) 10 MG tablet Take 10 mg by mouth at bedtime.     . calcium carbonate (TUMS - DOSED IN MG ELEMENTAL CALCIUM) 500 MG chewable tablet Chew 1-2 tablets by mouth as needed for indigestion or heartburn.     . citalopram (CELEXA) 20 MG tablet Take 20 mg by mouth daily.    . fluticasone (FLOVENT HFA) 220 MCG/ACT inhaler Inhale 2 puffs into the lungs 2 (two) times daily.    Marland Kitchen levofloxacin (LEVAQUIN) 750 MG tablet Take 1 tablet (750 mg total) by mouth daily. 7 tablet 0  . metFORMIN (GLUCOPHAGE) 500 MG tablet Take 1 tablet (500 mg total) by mouth 2 (two) times daily with a meal. 180 tablet 1  . pantoprazole (  PROTONIX) 40 MG tablet Take 1 tablet (40 mg total) by mouth daily. 30 tablet 0  . prazosin (MINIPRESS) 2 MG capsule Take 2 mg by mouth at bedtime.    . traZODone (DESYREL) 50 MG tablet Take 50 mg by mouth at bedtime.      Objective: BP 126/61   Pulse 74   Temp 99.4 F (37.4 C) (Oral)   Resp 18   SpO2 91%  Exam: GEN:  lying in bed, able to sit up in bed for exam, no apparent distress. Head: normocephalic and atraumatic  Eyes: conjunctiva without injection, sclera anicteric Nares: no  rhinorrhea, congestion or erythema Oropharynx: mmm without erythema or exudation HEM: negative for cervical or periauricular lymphadenopathies CVS: RRR, nl s1 & s2, no murmurs, no edema RESP: speaks in full sentence, no IWOB, rhonchi and wheeze bilaterally, no crackles GI: BS present & normal, soft, NTND, no guarding, no rebound, no mass GU: no suprapubic or CVA tenderness MSK: no focal tenderness or notable swelling SKIN: no apparent skin lesion NEURO: alert and oiented appropriately, no gross defecits  PSYCH: Increased volume of speech, slightly pressured, euthymic mood with congruent affect  Labs and Imaging: CBC BMET   Recent Labs Lab 05/10/16 1326  WBC 10.3  HGB 13.0  HCT 38.4  PLT 202    Recent Labs Lab 05/10/16 1326  NA 138  K 3.9  CL 106  CO2 21*  BUN 10  CREATININE 1.05*  GLUCOSE 124*  CALCIUM 9.0     Ct Angio Chest Pe W And/or Wo Contrast  Result Date: 05/10/2016  IMPRESSION: 1. No evidence of pulmonary embolus. 2. Hazy airspace opacification within the left lower lung lobe, compatible with pneumonia. Additional minimal airspace opacities within the right lower lobe and left upper lobe. 3. Small hiatal hernia noted. Electronically Signed   By: Roanna RaiderJeffery  Chang M.D.   On: 05/10/2016 18:17    Almon Herculesaye T Lilit Cinelli, MD 05/10/2016, 9:40 PM PGY-2, [place] [facility] FPTS Intern pager: [phone], text pages welcome

## 2016-05-10 NOTE — ED Notes (Signed)
Accidentally clicked of Ortho Vital signs, waiting for bolus to finish then going to complete.

## 2016-05-11 ENCOUNTER — Encounter (HOSPITAL_COMMUNITY): Payer: Self-pay | Admitting: *Deleted

## 2016-05-11 DIAGNOSIS — E860 Dehydration: Secondary | ICD-10-CM

## 2016-05-11 DIAGNOSIS — Z811 Family history of alcohol abuse and dependence: Secondary | ICD-10-CM | POA: Diagnosis not present

## 2016-05-11 DIAGNOSIS — J449 Chronic obstructive pulmonary disease, unspecified: Secondary | ICD-10-CM | POA: Diagnosis not present

## 2016-05-11 DIAGNOSIS — Z833 Family history of diabetes mellitus: Secondary | ICD-10-CM | POA: Diagnosis not present

## 2016-05-11 DIAGNOSIS — N179 Acute kidney failure, unspecified: Secondary | ICD-10-CM | POA: Diagnosis present

## 2016-05-11 DIAGNOSIS — R0602 Shortness of breath: Secondary | ICD-10-CM | POA: Diagnosis present

## 2016-05-11 DIAGNOSIS — R1310 Dysphagia, unspecified: Secondary | ICD-10-CM | POA: Diagnosis present

## 2016-05-11 DIAGNOSIS — Z8249 Family history of ischemic heart disease and other diseases of the circulatory system: Secondary | ICD-10-CM | POA: Diagnosis not present

## 2016-05-11 DIAGNOSIS — J441 Chronic obstructive pulmonary disease with (acute) exacerbation: Secondary | ICD-10-CM | POA: Diagnosis present

## 2016-05-11 DIAGNOSIS — Y95 Nosocomial condition: Secondary | ICD-10-CM | POA: Diagnosis present

## 2016-05-11 DIAGNOSIS — Z7951 Long term (current) use of inhaled steroids: Secondary | ICD-10-CM | POA: Diagnosis not present

## 2016-05-11 DIAGNOSIS — K449 Diaphragmatic hernia without obstruction or gangrene: Secondary | ICD-10-CM | POA: Diagnosis present

## 2016-05-11 DIAGNOSIS — Z7984 Long term (current) use of oral hypoglycemic drugs: Secondary | ICD-10-CM | POA: Diagnosis not present

## 2016-05-11 DIAGNOSIS — I10 Essential (primary) hypertension: Secondary | ICD-10-CM | POA: Diagnosis present

## 2016-05-11 DIAGNOSIS — Z87891 Personal history of nicotine dependence: Secondary | ICD-10-CM | POA: Diagnosis not present

## 2016-05-11 DIAGNOSIS — J440 Chronic obstructive pulmonary disease with acute lower respiratory infection: Secondary | ICD-10-CM | POA: Diagnosis present

## 2016-05-11 DIAGNOSIS — R05 Cough: Secondary | ICD-10-CM | POA: Diagnosis not present

## 2016-05-11 DIAGNOSIS — G4700 Insomnia, unspecified: Secondary | ICD-10-CM | POA: Diagnosis present

## 2016-05-11 DIAGNOSIS — J189 Pneumonia, unspecified organism: Secondary | ICD-10-CM | POA: Diagnosis present

## 2016-05-11 DIAGNOSIS — R7303 Prediabetes: Secondary | ICD-10-CM | POA: Diagnosis present

## 2016-05-11 DIAGNOSIS — F4310 Post-traumatic stress disorder, unspecified: Secondary | ICD-10-CM | POA: Diagnosis present

## 2016-05-11 DIAGNOSIS — Z823 Family history of stroke: Secondary | ICD-10-CM | POA: Diagnosis not present

## 2016-05-11 DIAGNOSIS — K219 Gastro-esophageal reflux disease without esophagitis: Secondary | ICD-10-CM | POA: Diagnosis present

## 2016-05-11 LAB — BASIC METABOLIC PANEL
Anion gap: 4 — ABNORMAL LOW (ref 5–15)
BUN: 7 mg/dL (ref 6–20)
CALCIUM: 8.2 mg/dL — AB (ref 8.9–10.3)
CO2: 25 mmol/L (ref 22–32)
Chloride: 110 mmol/L (ref 101–111)
Creatinine, Ser: 0.95 mg/dL (ref 0.44–1.00)
GFR, EST NON AFRICAN AMERICAN: 58 mL/min — AB (ref >60)
GLUCOSE: 98 mg/dL (ref 65–99)
Potassium: 3.7 mmol/L (ref 3.5–5.1)
Sodium: 139 mmol/L (ref 135–145)

## 2016-05-11 LAB — PROTIME-INR
INR: 1.05
PROTHROMBIN TIME: 13.8 s (ref 11.4–15.2)

## 2016-05-11 LAB — CBC
HCT: 33 % — ABNORMAL LOW (ref 36.0–46.0)
HEMOGLOBIN: 10.8 g/dL — AB (ref 12.0–15.0)
MCH: 30.3 pg (ref 26.0–34.0)
MCHC: 32.7 g/dL (ref 30.0–36.0)
MCV: 92.7 fL (ref 78.0–100.0)
Platelets: 172 10*3/uL (ref 150–400)
RBC: 3.56 MIL/uL — AB (ref 3.87–5.11)
RDW: 13.6 % (ref 11.5–15.5)
WBC: 9.1 10*3/uL (ref 4.0–10.5)

## 2016-05-11 LAB — GLUCOSE, CAPILLARY
GLUCOSE-CAPILLARY: 95 mg/dL (ref 65–99)
Glucose-Capillary: 126 mg/dL — ABNORMAL HIGH (ref 65–99)
Glucose-Capillary: 239 mg/dL — ABNORMAL HIGH (ref 65–99)
Glucose-Capillary: 221 mg/dL — ABNORMAL HIGH (ref 65–99)

## 2016-05-11 MED ORDER — IPRATROPIUM-ALBUTEROL 0.5-2.5 (3) MG/3ML IN SOLN
3.0000 mL | Freq: Three times a day (TID) | RESPIRATORY_TRACT | Status: DC
  Administered 2016-05-12 – 2016-05-13 (×4): 3 mL via RESPIRATORY_TRACT
  Filled 2016-05-11 (×4): qty 3

## 2016-05-11 MED ORDER — IPRATROPIUM-ALBUTEROL 0.5-2.5 (3) MG/3ML IN SOLN
3.0000 mL | RESPIRATORY_TRACT | Status: DC
  Administered 2016-05-11 (×3): 3 mL via RESPIRATORY_TRACT
  Filled 2016-05-11 (×2): qty 3

## 2016-05-11 NOTE — Progress Notes (Signed)
Family Medicine Teaching Service Daily Progress Note Intern Pager: [phone]  Patient name: Nichole Hunt Medical record number: [id number] Date of birth: 03/04/1941 Age: 76 y.o. Gender: female  Primary Care Provider: Massie Maroon, FNP Consultants: None Code Status: Full (per discussion on admission)  Pt Overview and Major Events to Date:  1/26: Admitted to FPTS  Assessment and Plan: Nichole Hunt is a 76 y.o. female presenting with HCAP and COPD exacerbation. PMH is significant for COPD, prediabetes, hypertension, PTSD  HCAP: Recent hospitalization 1/1-1/5 for influenza. Recent antibiotics (Levaquin on 1/24 by her pulmonologist). CTA chest with LLL pneumonia, RLL and LUL opacities concerning for pneumonia. .Patient has normal work of breathing, had a few episodes of hypoxia into the high 80's on room air this AM which have resolved. O2 now in the 90's on room air. Lung exam with rhonchi and expiratory wheeze bilaterally still. No leukocytosis.  -vital signs per floor protocol -Duonebs as below  -Incentive spirometry -Continue home Mucinex -Oxygen as needed to keep saturation greater than 90% next -Vancomycin and Zosyn (day 2)  COPD class D: with exacerbation. Second admission in a month. Home medications include DuoNeb's every 6 when necessary. No O2 requirement. Follows with outpatient pulmonologist Dr. Isaiah Serge.   -Prednisone 50 mg daily (Day2) -Antibiotic as above -Dulera daily -DuoNeb nebulizer every 4 scheduled -Protonix and his cough may also be due to GERD  Hypertension: Normotensive. Had some low blood pressures earlier this morning 95/54 but now normal. -Continue amlodipine  PTSD/Insomnia: Citalopram, trazodone and prazosin at home -Continue home medications  Prediabetes: Last A1c 6.3 three weeks ago. On metformin at home -SSI-thin -Old home metformin  FEN/GI:  -Car modified diet -Protonix as above  Prophylaxis:  -Lovenox  Disposition: Continue  inpatient treatment with IV antibiotics and nebulizers  Subjective:  Patient states that she slept okay last night. No acute events. She notes that her breathing is slightly better after receiving nebulizer treatments. She denies any fever last night of this morning. She admits to some chest pain only when she coughs.  Objective: Temp:  [98 F (36.7 C)-100.1 F (37.8 C)] 98.4 F (36.9 C) (01/27 0450) Pulse Rate:  [74-92] 91 (01/27 0450) Resp:  [17-19] 18 (01/27 0450) BP: (95-135)/(54-87) 95/54 (01/27 0450) SpO2:  [88 %-98 %] 89 % (01/27 0806) Weight:  [181 lb 3.2 oz (82.2 kg)] 181 lb 3.2 oz (82.2 kg) (01/26 2019) Physical Exam: General: In no acute distress, sitting up in bed eating breakfast Cardiovascular: Regular rate and rhythm, normal S1-S2, no murmurs appreciated, no edema, 2+ DP pulses bilaterally Respiratory: Normal work of breathing, inspiratory and expiratory wheezing heard in bilateral lung fields. Scattered coarse breath sounds throughout Abdomen: Soft, nondistended, nontender, normal bowel sounds Extremities: No edema  Laboratory:  Recent Labs Lab 05/10/16 1326 05/11/16 0538  WBC 10.3 9.1  HGB 13.0 10.8*  HCT 38.4 33.0*  PLT 202 172    Recent Labs Lab 05/08/16 1110 05/10/16 1326 05/11/16 0538  NA 138 138 139  K 4.0 3.9 3.7  CL 103 106 110  CO2 25 21* 25  BUN 11 10 7   CREATININE 1.06* 1.05* 0.95  CALCIUM 10.2 9.0 8.2*  PROT 6.3 6.6  --   BILITOT 0.5 0.7  --   ALKPHOS 57 50  --   ALT 23 25  --   AST 25 26  --   GLUCOSE 118* 124* 98    BNP    Component Value Date/Time   BNP 42.9 05/10/2016 2144   Pct:  0.41 Troponin negative  Imaging/Diagnostic Tests: Ct Angio Chest Pe W And/or Wo Contrast  Result Date: 05/10/2016 CLINICAL DATA:  Subacute onset of cough, shortness of breath, vomiting and hemoptysis. Initial encounter. EXAM: CT ANGIOGRAPHY CHEST WITH CONTRAST TECHNIQUE: Multidetector CT imaging of the chest was performed using the standard  protocol during bolus administration of intravenous contrast. Multiplanar CT image reconstructions and MIPs were obtained to evaluate the vascular anatomy. CONTRAST:  100 mL of Isovue 370 IV contrast COMPARISON:  Chest radiograph performed 05/08/2016 FINDINGS: Cardiovascular:  There is no evidence of pulmonary embolus. The heart is borderline normal in size. Minimal calcification is seen along the aortic arch and descending thoracic aorta. The great vessels are unremarkable in appearance. Mediastinum/Nodes: Trace pericardial fluid likely remains within normal limits. No mediastinal lymphadenopathy is seen. The thyroid gland is unremarkable. No axillary lymphadenopathy is appreciated. Lungs/Pleura: Hazy airspace opacification is noted within the left lower lobe, compatible with pneumonia. Additional minimal opacities are seen within the right lower lobe and left upper lobe. The lungs are otherwise grossly clear. No pleural effusion or pneumothorax is seen. No dominant mass is identified. Upper Abdomen: The visualized portions of the liver and spleen are unremarkable. A small hiatal hernia is noted. Musculoskeletal: No acute osseous abnormalities are identified. Degenerative change is noted along the lower cervical spine. The visualized musculature is unremarkable in appearance. Review of the MIP images confirms the above findings. IMPRESSION: 1. No evidence of pulmonary embolus. 2. Hazy airspace opacification within the left lower lung lobe, compatible with pneumonia. Additional minimal airspace opacities within the right lower lobe and left upper lobe. 3. Small hiatal hernia noted. Electronically Signed   By: Roanna RaiderJeffery  Chang M.D.   On: 05/10/2016 18:17    Nichole Dinninghristina M Gambino, MD 05/11/2016, 9:12 AM PGY-2, [place] [facility] FPTS Intern pager: [phone], text pages welcome

## 2016-05-11 NOTE — Plan of Care (Signed)
Problem: Health Behavior/Discharge Planning: Goal: Ability to manage health-related needs will improve Outcome: Progressing Discussed in depth diabetes and hypertension and interventions for each disease process.

## 2016-05-11 NOTE — Progress Notes (Signed)
New Admission Note:   Arrival Method: Via stretcher from the ED Mental Orientation:  A & O x 4 Telemetry: None Assessment: Completed Skin:  Dry  IV:  Rt wrist and Rt AC Pain:  C/o generalized body achiness Tubes:  None Safety Measures: Safety Fall Prevention Plan has been given, discussed and signed Admission: Completed [address] [place] Orientation: Patient has been orientated to the room, unit and staff.  Family:  Granddaughters at bedside  [place]  She relocated in early December to be near family.  She relocates periodically to visit various family members.   She has multiple pieces of jewelry on.  I have advised for her to send these home with her family.  Currently, she wants to keep jewelry with her.  She is aware that [facility] is not responsible for lost or stolen items.  Orders have been reviewed and implemented. Will continue to monitor the patient. Call light has been placed within reach and bed alarm has been activated.   Bernie CoveyKimberly Rorik Vespa RN- Marsa ArisBC, [place] Phone number: [phone]

## 2016-05-12 LAB — CBC
HCT: 32.7 % — ABNORMAL LOW (ref 36.0–46.0)
HEMOGLOBIN: 10.9 g/dL — AB (ref 12.0–15.0)
MCH: 30.5 pg (ref 26.0–34.0)
MCHC: 33.3 g/dL (ref 30.0–36.0)
MCV: 91.6 fL (ref 78.0–100.0)
PLATELETS: 200 10*3/uL (ref 150–400)
RBC: 3.57 MIL/uL — AB (ref 3.87–5.11)
RDW: 13.4 % (ref 11.5–15.5)
WBC: 9.3 10*3/uL (ref 4.0–10.5)

## 2016-05-12 LAB — BASIC METABOLIC PANEL
ANION GAP: 5 (ref 5–15)
BUN: 7 mg/dL (ref 6–20)
CHLORIDE: 109 mmol/L (ref 101–111)
CO2: 25 mmol/L (ref 22–32)
Calcium: 8.8 mg/dL — ABNORMAL LOW (ref 8.9–10.3)
Creatinine, Ser: 0.92 mg/dL (ref 0.44–1.00)
Glucose, Bld: 135 mg/dL — ABNORMAL HIGH (ref 65–99)
POTASSIUM: 3.7 mmol/L (ref 3.5–5.1)
SODIUM: 139 mmol/L (ref 135–145)

## 2016-05-12 LAB — GLUCOSE, CAPILLARY
GLUCOSE-CAPILLARY: 110 mg/dL — AB (ref 65–99)
GLUCOSE-CAPILLARY: 130 mg/dL — AB (ref 65–99)
GLUCOSE-CAPILLARY: 115 mg/dL — AB (ref 65–99)
Glucose-Capillary: 171 mg/dL — ABNORMAL HIGH (ref 65–99)

## 2016-05-12 LAB — PROCALCITONIN: Procalcitonin: 0.35 ng/mL

## 2016-05-12 MED ORDER — DOXYCYCLINE HYCLATE 100 MG PO TABS
100.0000 mg | ORAL_TABLET | Freq: Two times a day (BID) | ORAL | Status: DC
  Administered 2016-05-13 – 2016-05-14 (×3): 100 mg via ORAL
  Filled 2016-05-12 (×3): qty 1

## 2016-05-12 MED ORDER — GUAIFENESIN-CODEINE 100-10 MG/5ML PO SOLN
5.0000 mL | Freq: Four times a day (QID) | ORAL | Status: DC | PRN
  Administered 2016-05-12 – 2016-05-13 (×3): 5 mL via ORAL
  Filled 2016-05-12 (×3): qty 5

## 2016-05-12 NOTE — Plan of Care (Signed)
Problem: Activity: Goal: Risk for activity intolerance will decrease Outcome: Progressing Patient occasionally walks halls throughout day. Tolerates well.   Problem: Nutrition: Goal: Adequate nutrition will be maintained Outcome: Progressing Patient adhering to prescribed diet; very receptive to dietary education.

## 2016-05-12 NOTE — Progress Notes (Addendum)
Chaplain received request via RN for [employer]; RN clarified if patient desired Catholic priest (yes).  Chaplain initiated process to request priest. [facility]. Chaplain communicated with Father Billey GoslingCharlie, who said he would come late in the afternoon.  Chaplain went to inform patient of priest's availability.  Patient was up and walking the hall when chaplain arrived, but she immediately asked chaplain to come in and sit, so she could share her stories. This patient is a delightful, extremely loquacious, and likely strong-willed person, who is very family-oriented and active in her faith.  She states that God is very important to her; that "He has never let me down". She said she is insistent on getting answers about the bewildering symptoms she has been having for some time (since 2016).  She also stated that she is very happy with her caregivers and her care, but wants them to be "straight up" with her at all times.    Patient shared numerous stories of [place] where she rotates living with each of her four children and nine grandchildren through the year.  She was raised strictly Catholic, but now attends churches that are most convenient to the homes of her children since [facility] are not always nearby.  She likes to receive Holy Communion from a Catholic priest, but if it is not available, she would be delighted to receive communion or prayer from anyone, such as a hospital chaplain. Patient indicated that she just recently found a church [place] to attend while in [place].  Chaplain offered to contact that pastor, but patient stated that a parishioner had contacted her earlier in the week and patient will inform church office through this new friend.   Patient appears to manage her anxiety by verbalizing stories about her life, her family, her friends and her current situation about which  she is growing increasingly worried.  She presents as a highly self-confident person, but may hide loneliness and deeper insecurities. Patient would certainly benefit from f/u care by a chaplain as a listening post and prayer-giver.  She was very appreciative of visit and prayer.    Please contact on-call chaplain if Catholic priest does not come today.  Will request f/u by floor chaplain.    Theodoro Parmaalacios, Shemeka Wardle N, Chaplain [phone]    05/12/16 1320  Clinical Encounter Type  Visited With Patient  Visit Type Initial (patient requests communion from a priest)  Referral From Patient  Consult/Referral To Chaplain  Spiritual Encounters  Spiritual Needs Ritual;Prayer  Stress Factors  Patient Stress Factors Lack of knowledge;Health changes

## 2016-05-12 NOTE — Progress Notes (Signed)
Family Medicine Teaching Service Daily Progress Note Intern Pager: [phone]  Patient name: Nichole Hunt Medical record number: [id number] Date of birth: 08-09-1940 Age: 76 y.o. Gender: female  Primary Care Provider: Massie MaroonHollis,Lachina M, FNP Consultants: None Code Status: Full (per discussion on admission)  Pt Overview and Major Events to Date:  1/26: Admitted to FPTS  Assessment and Plan: Nichole Ingamela Lessner is a 76 y.o. female presenting with HCAP and COPD exacerbation. PMH is significant for COPD, prediabetes, hypertension, PTSD  HCAP/Chronic Cough: Recent hospitalization 1/1-1/5 for influenza. Recent antibiotics (Levaquin on 1/24 by her pulmonologist). CTA chest with LLL pneumonia, RLL and LUL opacities concerning for pneumonia. Stable on room air -Vancomycin and Zosyn (1/27- ) -Start doxycycline 1/29 (has had previous course of levaquin  -Duonebs as below  -Incentive spirometry -Continue home Mucinex -Oxygen as needed to keep saturation greater than 90%  COPD class D: with exacerbation. Second admission in a month. Home medications include DuoNeb's every 6 when necessary. Stable on room air. Follows with outpatient pulmonologist Dr. Isaiah SergeMannam.   -Prednisone 50 mg daily (1/27- ) -Antibiotic as above -Dulera daily -DuoNeb nebulizer every 4 scheduled  Hypertension: Normotensive.  -Continue amlodipine  PTSD/Insomnia: Citalopram, trazodone and prazosin at home -Continue home medications  Prediabetes: Last A1c 6.3 three weeks ago. On metformin at home -SSI-thin -Hold home metformin  FEN/GI:  -Carb modified diet -Protonix as above  Prophylaxis:  -Lovenox  Disposition: Continue inpatient treatment with IV antibiotics and nebulizers  Subjective:  Doing about the same this morning. Still is worried about her chronic nonproductive cough. No fevers or rigors. Has some shortness of breath when coughing.   Objective: Temp:  [97.5 F (36.4 C)-98.3 F (36.8 C)] 97.5 F (36.4 C)  (01/28 0532) Pulse Rate:  [66-82] 72 (01/28 0532) Resp:  [18] 18 (01/28 0532) BP: (123-140)/(57-68) 140/68 (01/28 0532) SpO2:  [92 %-95 %] 93 % (01/28 0839) Physical Exam: General:75yo F in NAD sitting in hospital bed Cardiovascular: RRR Respiratory: Normal work of breathing, inspiratory and expiratory wheezing heard in bilateral lung fields. Scattered coarse breath sounds throughout Abdomen: Soft, nondistended, nontender, normal bowel sounds Extremities: No edema  Laboratory:  Recent Labs Lab 05/10/16 1326 05/11/16 0538 05/12/16 0524  WBC 10.3 9.1 9.3  HGB 13.0 10.8* 10.9*  HCT 38.4 33.0* 32.7*  PLT 202 172 200    Recent Labs Lab 05/08/16 1110 05/10/16 1326 05/11/16 0538 05/12/16 0524  NA 138 138 139 139  K 4.0 3.9 3.7 3.7  CL 103 106 110 109  CO2 25 21* 25 25  BUN 11 10 7 7   CREATININE 1.06* 1.05* 0.95 0.92  CALCIUM 10.2 9.0 8.2* 8.8*  PROT 6.3 6.6  --   --   BILITOT 0.5 0.7  --   --   ALKPHOS 57 50  --   --   ALT 23 25  --   --   AST 25 26  --   --   GLUCOSE 118* 124* 98 135*   Imaging/Diagnostic Tests: No results found.  Ardith Darkaleb M Parker, MD 05/12/2016, 9:01 AM PGY-3, [place] [facility] FPTS Intern pager: [phone], text pages welcome

## 2016-05-13 ENCOUNTER — Telehealth: Payer: Self-pay | Admitting: Pulmonary Disease

## 2016-05-13 LAB — GLUCOSE, CAPILLARY
GLUCOSE-CAPILLARY: 94 mg/dL (ref 65–99)
GLUCOSE-CAPILLARY: 130 mg/dL — AB (ref 65–99)
Glucose-Capillary: 118 mg/dL — ABNORMAL HIGH (ref 65–99)
Glucose-Capillary: 126 mg/dL — ABNORMAL HIGH (ref 65–99)
Glucose-Capillary: 135 mg/dL — ABNORMAL HIGH (ref 65–99)
Glucose-Capillary: 137 mg/dL — ABNORMAL HIGH (ref 65–99)

## 2016-05-13 LAB — BASIC METABOLIC PANEL
Anion gap: 5 (ref 5–15)
BUN: 9 mg/dL (ref 6–20)
CALCIUM: 8.8 mg/dL — AB (ref 8.9–10.3)
CHLORIDE: 110 mmol/L (ref 101–111)
CO2: 24 mmol/L (ref 22–32)
CREATININE: 0.93 mg/dL (ref 0.44–1.00)
GFR calc non Af Amer: 60 mL/min — ABNORMAL LOW (ref >60)
Glucose, Bld: 104 mg/dL — ABNORMAL HIGH (ref 65–99)
Potassium: 3.5 mmol/L (ref 3.5–5.1)
Sodium: 139 mmol/L (ref 135–145)

## 2016-05-13 LAB — CBC
HEMATOCRIT: 33.4 % — AB (ref 36.0–46.0)
HEMOGLOBIN: 11 g/dL — AB (ref 12.0–15.0)
MCH: 30 pg (ref 26.0–34.0)
MCHC: 32.9 g/dL (ref 30.0–36.0)
MCV: 91 fL (ref 78.0–100.0)
Platelets: 236 10*3/uL (ref 150–400)
RBC: 3.67 MIL/uL — ABNORMAL LOW (ref 3.87–5.11)
RDW: 13.6 % (ref 11.5–15.5)
WBC: 9.3 10*3/uL (ref 4.0–10.5)

## 2016-05-13 MED ORDER — DOXYCYCLINE HYCLATE 100 MG PO TABS
100.0000 mg | ORAL_TABLET | Freq: Two times a day (BID) | ORAL | 0 refills | Status: DC
Start: 2016-05-13 — End: 2016-05-14

## 2016-05-13 MED ORDER — PREDNISONE 50 MG PO TABS
50.0000 mg | ORAL_TABLET | Freq: Every day | ORAL | 0 refills | Status: DC
Start: 2016-05-13 — End: 2016-05-14

## 2016-05-13 MED ORDER — TIOTROPIUM BROMIDE MONOHYDRATE 18 MCG IN CAPS
18.0000 ug | ORAL_CAPSULE | Freq: Every day | RESPIRATORY_TRACT | Status: DC
  Filled 2016-05-13: qty 5

## 2016-05-13 MED ORDER — TIOTROPIUM BROMIDE MONOHYDRATE 18 MCG IN CAPS: 18.0000 ug | ORAL_CAPSULE | Freq: Every day | RESPIRATORY_TRACT | 0 refills | Status: DC

## 2016-05-13 MED ORDER — IPRATROPIUM-ALBUTEROL 0.5-2.5 (3) MG/3ML IN SOLN
3.0000 mL | Freq: Four times a day (QID) | RESPIRATORY_TRACT | Status: DC | PRN
  Administered 2016-05-13: 3 mL via RESPIRATORY_TRACT
  Filled 2016-05-13: qty 3

## 2016-05-13 NOTE — Telephone Encounter (Signed)
I called and spoke with the patient. I am unable to seen her today. I will ask the floor consult team to see her in the hospital.   PM

## 2016-05-13 NOTE — Progress Notes (Signed)
Family Medicine Teaching Service Daily Progress Note Intern Pager: [phone]  Patient name: Nichole Hunt Medical record number: [id number] Date of birth: Mar 24, 1941 Age: 76 y.o. Gender: female  Primary Care Provider: Dorena Dew, FNP Consultants: None Code Status: Full  Pt Overview and Major Events to Date:  01/26: Admit for HAP/COPD exacerbation in the context of positive influenza 01/29: Transition to oral antibiotic  Assessment and Plan: Nichole Hunt is a 76 y.o. female presenting with cough. PMH is significant for COPD, prediabetes, hypertension, PTSD  #Productive cough with dyspnea: Acute, improved. Likely due to COPD exacerbation in the setting of pneumonia. She has some hemoptysis on admission but CTA negative for PE. Heart failure unlikely given without signs of fluid overload. Lung exam with minimal rhonchi and expiratory wheeze bilaterally suggestive for COPD exacerbation. She denies exertional chest pain to think of ACS. HEART score 3. Troponin negative. CT chest showed small hiatal hernia. Could complicate GERD causing chronic cough. --COPD as below --Pneumonia as below --Pantoprazole 40 mg QD --Incentive spirometry --Continue home Mucinex --Oxygen as needed to keep saturation greater than 90%  #COPD class D: Chronic, improved. Presented with exacerbation. Second admission in a month. High CAT score. On DuoNeb nebulizer, Symbicort at home. Exam with rhonchorous and expiratory wheeze bilaterally. No increased work of breathing. Wondering if GERD is playing a role here.  --Prednisone 50 mg QD (day 3, 1/27>) --Antibiotic as below --Dulera QD --DuoNeb nebulizer q6h --Protonix as above --Route HPI to Dr. Vaughan Browner  #Healthcare acquired pneumonia: Acute, improved. CTA chest with LLL pneumonia, RLL and LUL opacities concerning for pneumonia. PSI 78 including subjective fever. Given recent hospitalizations, recent antibiotics (Levaquin 2 days ago by her pulmonologist), and  underlying COPD, will treat with broad-spectrum antibiotic as below. Procalcitonin trending down, 0.41>0.35. --Transitioning IV Zosyn and vancomycin (3 days) to PO doxycycline (1/29>) --Procalcitonin --Incentive spirometry  #Hypertension: Normotensive on admission.  --Continue amlodipine  #PTSD/Insomnia: Citalopram, trazodone and prazosin at home --Continue home medications  #Prediabetes: Last A1c 6.3 three weeks ago. On metformin at home --Sensitive sliding scale --Hold home metformin  FEN/GI: Carb modified diet, PPI PPx: Lovenox SQ  Disposition: Improvement of HAP/COPD exacerbation in the setting of positive influenza  Subjective:  Patient continues complaining of nagging cough with less sputum production. Continues to remain without chest pain or shortness of breath. Patient continues to verbalize her concern for having pneumonia and asking for ways to help prevent future risk of contracting infections giving her COPD.  Objective: Temp:  [98.1 F (36.7 C)-98.4 F (36.9 C)] 98.3 F (36.8 C) (01/29 0500) Pulse Rate:  [70-73] 73 (01/29 0500) Resp:  [18-20] 20 (01/29 0500) BP: (131-158)/(58-74) 158/74 (01/29 0500) SpO2:  [94 %-96 %] 96 % (01/29 0500) Weight:  [183 lb 6.4 oz (83.2 kg)] 183 lb 6.4 oz (83.2 kg) (01/29 0500) Physical Exam: General: well nourished, well developed, in no acute distress with non-toxic appearance HEENT: normocephalic, atraumatic, moist mucous membranes CV: regular rate and rhythm without murmurs, rubs, or gallops Lungs: mild expiratory wheeze bilaterally with normal work of breathing on RA Abdomen: soft, non-tender, normoactive bowel sounds Skin: warm, dry, no rashes or lesions, cap refill < 2 seconds Extremities: warm and well perfused, normal tone  Laboratory:  Recent Labs Lab 05/11/16 0538 05/12/16 0524 05/13/16 0529  WBC 9.1 9.3 9.3  HGB 10.8* 10.9* 11.0*  HCT 33.0* 32.7* 33.4*  PLT 172 200 236    Recent Labs Lab 05/08/16 1110   05/10/16 1326 05/11/16 0538 05/12/16 0524 05/13/16 8250  NA 138  --  138 139 139 139  K 4.0  --  3.9 3.7 3.7 3.5  CL 103  --  106 110 109 110  CO2 25  --  21* _0 BUN 11  --  _1 CREATININE 1.06*  < > 1.05* 0.95 0.92 0.93  CALCIUM 10.2  --  9.0 8.2* 8.8* 8.8*  PROT 6.3  --  6.6  --   --   --   BILITOT 0.5  --  0.7  --   --   --   ALKPHOS 57  --  50  --   --   --   ALT 23  --  25  --   --   --   AST 25  --  26  --   --   --   GLUCOSE 118*  --  124* 98 135* 104*  < > = values in this interval not displayed.  Urinalysis    Component Value Date/Time   COLORURINE STRAW (A) 05/10/2016 1955   APPEARANCEUR CLEAR 05/10/2016 1955   LABSPEC >1.046 (H) 05/10/2016 1955   PHURINE 5.0 05/10/2016 1955   GLUCOSEU NEGATIVE 05/10/2016 1955   HGBUR SMALL (A) 05/10/2016 1955   BILIRUBINUR NEGATIVE 05/10/2016 1955   KETONESUR 20 (A) 05/10/2016 1955   PROTEINUR NEGATIVE 05/10/2016 1955   UROBILINOGEN 0.2 05/08/2016 1122   NITRITE NEGATIVE 05/10/2016 1955   LEUKOCYTESUR TRACE (A) 05/10/2016 1955   Lipase: 22 ESR: 21 Lactic acid: 1.08>0.42 Influenza: Negative BNP: 42.9 Troponin: Negative Procalcitonin: 0.41>0.35 INR: 1.05  Imaging/Diagnostic Tests: CT Angio Chest PE W and/or Wo Contrast (05/10/2016) FINDINGS: Cardiovascular:  There is no evidence of pulmonary embolus.  The heart is borderline normal in size. Minimal calcification is seen along the aortic arch and descending thoracic aorta. The great vessels are unremarkable in appearance.  Mediastinum/Nodes: Trace pericardial fluid likely remains within normal limits. No mediastinal lymphadenopathy is seen. The thyroid gland is unremarkable. No axillary lymphadenopathy is appreciated.  Lungs/Pleura: Hazy airspace opacification is noted within the left lower lobe, compatible with pneumonia. Additional minimal opacities are seen within the right lower lobe and left upper lobe. The lungs are otherwise grossly clear. No  pleural effusion or pneumothorax is seen. No dominant mass is identified.  Upper Abdomen: The visualized portions of the liver and spleen are unremarkable. A small hiatal hernia is noted.  Musculoskeletal: No acute osseous abnormalities are identified. Degenerative change is noted along the lower cervical spine. The visualized musculature is unremarkable in appearance.  Review of the MIP images confirms the above findings.  IMPRESSION: 1. No evidence of pulmonary embolus. 2. Hazy airspace opacification within the left lower lung lobe, compatible with pneumonia. Additional minimal airspace opacities within the right lower lobe and left upper lobe.  3. Small hiatal hernia noted.     Lusby Bing, DO 05/13/2016, 9:21 AM PGY-1, [place] Intern pager: [phone], text pages welcome

## 2016-05-13 NOTE — Progress Notes (Addendum)
Pt ambulated in the hallway with walker, O2 sats between 95-97% on RA while ambulating.

## 2016-05-13 NOTE — Telephone Encounter (Signed)
Mykel is calling back asking to speak with Dr. Isaiah SergeMannam, stating they are talking about releasing the patient from the hospital with pneumonia, which the patient is not happy about.  I advised a note has been sent to Dr. Isaiah SergeMannam.  She asked that I put another message back.  Mykel's call back # is [phone].

## 2016-05-13 NOTE — Discharge Summary (Signed)
[facility] Discharge Summary  Patient name: Nichole Hunt Medical record number: [id number] Date of birth: 19-Dec-1940 Age: 76 y.o. Gender: female Date of Admission: 05/10/2016  Date of Discharge: 05/14/2016 Admitting Physician: Kinnie Feil, MD  Primary Care Provider: Dorena Dew, FNP Consultants: None  Indication for Hospitalization: COPD exacerbation  Discharge Diagnoses/Problem List:  COPD exacerbation Healthcare acquired pneumonia Hypertension PTSD/Insomnia Prediabetes  Disposition: Home  Discharge Condition: Stable, imporved  Discharge Exam:  General: well nourished, well developed, in no acute distress with non-toxic appearance HEENT: normocephalic, atraumatic, moist mucous membranes CV: regular rate and rhythm without murmurs, rubs, or gallops Lungs: minimal expiratory wheeze bilaterally with normal work of breathing on RA Abdomen: soft, non-tender, normoactive bowel sounds Skin: warm, dry, no rashes or lesions, cap refill < 2 seconds Extremities: warm and well perfused, normal tone  Brief Hospital Course:  Nichole Hunt a 76 y.o.femalepresenting with cough. PMH is significant for COPD, prediabetes, hypertension, PTSD.  Patient presented with cough and SOB for 3 weeks. Worse at night when patient slept. Had fever 101F at home. Called Pulmonologist because wheezing and worsening symptoms, advised to go to ED.  Was treated as COPD exacerbation. Flu positive, given Tamiflu. Had desat 88% which resolved without O2. CBC, CMET, and lactic acid wnl. UA neg for infection. CTA neg for PE but c/w LLL PNA and RLL and LUL opacities concerning for PNA. Patient showed improvement without need for O2 and continued steroids and abx with transition to doxy. VS remained stable and afebrile. There was concern for dysphagia so patient underwent esophogram and SCL-70 obtained by patient pulmonologist. Patient was safe for d/c with continuation of  steroid taper, completion of abx, and pulm f/u for PFTs.  Issues for Follow Up:  1. Prednisone taper as follows: 50 mg 1 day > 30 mg 3 days > 10 mg 3 days. 2. Doxy 100 mg BID given for 5 more days (total 10 days abx for HAP). Check CBC at PCP f/u. 3. Patient given Breo-Ellipta on d/c for controller given insurance. Patient to resume albuterol inhaler.  4. Patient to f/u with Pulmonology for PFT on 2/11 at 1100, and office on 2/26 at 1130. 5. BP elevated during hospitalization. Increased amlodipine to 10 mg daily. 6. Resume metformin on d/c. Check creatinine level.  Significant Procedures: None  Significant Labs and Imaging:   Recent Labs Lab 05/11/16 0538 05/12/16 0524 05/13/16 0529  WBC 9.1 9.3 9.3  HGB 10.8* 10.9* 11.0*  HCT 33.0* 32.7* 33.4*  PLT 172 200 236    Recent Labs Lab 05/10/16 1326 05/11/16 0538 05/12/16 0524 05/13/16 0529  NA 138 139 139 139  K 3.9 3.7 3.7 3.5  CL 106 110 109 110  CO2 21* _0 GLUCOSE 124* 98 135* 104*  BUN _1 CREATININE 1.05* 0.95 0.92 0.93  CALCIUM 9.0 8.2* 8.8* 8.8*  ALKPHOS 50  --   --   --   AST 26  --   --   --   ALT 25  --   --   --   ALBUMIN 3.4*  --   --   --    Urinalysis    Component Value Date/Time   COLORURINE STRAW (A) 05/10/2016 1955   APPEARANCEUR CLEAR 05/10/2016 1955   LABSPEC >1.046 (H) 05/10/2016 1955   PHURINE 5.0 05/10/2016 1955   GLUCOSEU NEGATIVE 05/10/2016 1955   HGBUR SMALL (A) 05/10/2016 [place] NEGATIVE 05/10/2016 1955  KETONESUR 20 (A) 05/10/2016 1955   PROTEINUR NEGATIVE 05/10/2016 1955   UROBILINOGEN 0.2 05/08/2016 1122   NITRITE NEGATIVE 05/10/2016 1955   LEUKOCYTESUR TRACE (A) 05/10/2016 1955   Lipase: 22 ESR: 21 Lactic acid: 1.08>0.42 Influenza: Negative BNP: 42.9 Troponin: Negative Procalcitonin: 0.41>0.35 INR: 1.05 SCL-70: Pending  Imaging/Diagnostic Tests: CT Angio Chest PE W and/or Wo Contrast (05/10/2016) FINDINGS: Cardiovascular: There is no evidence  of pulmonary embolus.  The heart is borderline normal in size. Minimal calcification is seen along the aortic arch and descending thoracic aorta. The great vessels are unremarkable in appearance.  Mediastinum/Nodes: Trace pericardial fluid likely remains within normal limits. No mediastinal lymphadenopathy is seen. The thyroid gland is unremarkable. No axillary lymphadenopathy is appreciated.  Lungs/Pleura: Hazy airspace opacification is noted within the left lower lobe, compatible with pneumonia. Additional minimal opacities are seen within the right lower lobe and left upper lobe. The lungs are otherwise grossly clear. No pleural effusion or pneumothorax is seen. No dominant mass is identified.  Upper Abdomen: The visualized portions of the liver and spleen are unremarkable. A small hiatal hernia is noted.  Musculoskeletal: No acute osseous abnormalities are identified. Degenerative change is noted along the lower cervical spine. The visualized musculature is unremarkable in appearance.  Review of the MIP images confirms the above findings.  IMPRESSION: 1. No evidence of pulmonary embolus. 2. Hazy airspace opacification within the left lower lung lobe, compatible with pneumonia. Additional minimal airspace opacities within the right lower lobe and left upper lobe.  3. Small hiatal hernia noted.  Results/Tests Pending at Time of Discharge: SCL-70, Esophogram  Discharge Medications:  Allergies as of 05/14/2016   No Known Allergies     Medication List    STOP taking these medications   levofloxacin 750 MG tablet Commonly known as:  LEVAQUIN     TAKE these medications   albuterol 108 (90 Base) MCG/ACT inhaler Commonly known as:  PROVENTIL HFA;VENTOLIN HFA Inhale 1-2 puffs into the lungs every 6 (six) hours as needed for wheezing or shortness of breath.   amLODipine 10 MG tablet Commonly known as:  NORVASC Take 1 tablet (10 mg total) by mouth daily. What  changed:  medication strength  how much to take   atorvastatin 10 MG tablet Commonly known as:  LIPITOR Take 10 mg by mouth at bedtime.   calcium carbonate 500 MG chewable tablet Commonly known as:  TUMS - dosed in mg elemental calcium Chew 1-2 tablets by mouth as needed for indigestion or heartburn.   citalopram 20 MG tablet Commonly known as:  CELEXA Take 20 mg by mouth daily.   doxycycline 100 MG tablet Commonly known as:  VIBRA-TABS Take 1 tablet (100 mg total) by mouth every 12 (twelve) hours.   fluticasone 220 MCG/ACT inhaler Commonly known as:  FLOVENT HFA Inhale 2 puffs into the lungs 2 (two) times daily.   guaiFENesin 600 MG 12 hr tablet Commonly known as:  MUCINEX Take 1,200 mg by mouth 2 (two) times daily as needed for cough or to loosen phlegm.   ipratropium-albuterol 0.5-2.5 (3) MG/3ML Soln Commonly known as:  DUONEB Take 3 mLs by nebulization every 6 (six) hours as needed (for wheezing/shortness of breath).   metFORMIN 500 MG tablet Commonly known as:  GLUCOPHAGE Take 1 tablet (500 mg total) by mouth 2 (two) times daily with a meal.   mometasone-formoterol 200-5 MCG/ACT Aero Commonly known as:  DULERA Inhale 2 puffs into the lungs 2 (two) times daily.   pantoprazole 40  MG tablet Commonly known as:  PROTONIX Take 1 tablet (40 mg total) by mouth daily.   prazosin 2 MG capsule Commonly known as:  MINIPRESS Take 2 mg by mouth at bedtime.   predniSONE 10 MG tablet Commonly known as:  DELTASONE Take 5 tabs daily for 1 day, then 3 tabs daily for 3 days, then 1 tab daily for 3 days.   traZODone 50 MG tablet Commonly known as:  DESYREL Take 50 mg by mouth at bedtime.       Discharge Instructions: Please refer to Patient Instructions section of EMR for full details.  Patient was counseled important signs and symptoms that should prompt return to medical care, changes in medications, dietary instructions, activity restrictions, and follow up  appointments.   Follow-Up Appointments: Follow-up Information    Schedule an appointment as soon as possible for a visit with Dorena Dew, FNP.   Specialty:  Family Medicine Why:  Appointment Date: 05/28/2016 at 1:15p Contact information: [address] [place] [address] [facility]. Go on 05/28/2016.   Specialty:  Pulmonology Why:  Go to appointment at 11:00 AM. Contact information: [place] Sparkman, DO 05/15/2016, 8:21 PM PGY-1, [place]

## 2016-05-13 NOTE — Progress Notes (Signed)
Transitions of Care Pharmacy Note  Plan:  Educated on new Spiriva inhaler, doxycycline, and prednisone . I walked through how to administer Spiriva and how it is different than a steroid inhaler.  I also explained how to administer doxycycline in regards to dairy products and multivitamins.  Patient was very concerned about being discharged and states that she wants to be on the oral antibiotics for awhile before discharge. I explained that her team would observe how she did on the oral antibiotic and make a decision.   --------------------------------------------- Nichole Hunt is an 76 y.o. female who presents with a chief complaint of a cough . In anticipation of discharge, pharmacy has reviewed this patient's prior to admission medication history, as well as [facility]  Current medication indications, dosing, frequency, and notable side effects reviewed with patient. patient verbalized understanding of current inpatient medication regimen and is aware that the After Visit Summary when presented, will represent the most accurate medication list at discharge.   Nichole Hunt expressed concerns regarding being discharged and her ability to breath. I explained that pneumonia can take awhile to clear and we would see how she did on the oral antibiotics. I also emphasized that it would take awhile for her cough to go away.   Assessment: Understanding of regimen: good Understanding of indications: good Potential of compliance: good Barriers to Obtaining Medications: No  Patient instructed to contact inpatient pharmacy team with further questions or concerns if needed.    Time spent preparing for discharge counseling: 10 minutes Time spent counseling patient:40 minutes    Thank you for allowing pharmacy to be a part of this patient's care.  Hillis RangeEmily A Stewart, PharmD PGY1 Pharmacy Resident  Pager: [phone]

## 2016-05-13 NOTE — Telephone Encounter (Signed)
PM  Please see previous message about this, daughter and law called back and stated it looks like they are trying to release her today

## 2016-05-13 NOTE — Telephone Encounter (Signed)
[facility]in[facility]law. Mykel states the pt is requesting PM to see her while being admitted. Mykel aware that per the schedule it doesn't look like PM rounding today 05/13/16. Mykel verbalized understanding and states if PM is available to visit the pt would greatly appreciate it. Mykel is requesting a call back with PM's response.   Dr. Isaiah SergeMannam please advise. Thanks.

## 2016-05-13 NOTE — Telephone Encounter (Signed)
PM was able to speak with daughter and law, will close this message.

## 2016-05-13 NOTE — Plan of Care (Signed)
Problem: Food- and Nutrition-Related Knowledge Deficit (NB-1.1) Goal: Nutrition education Formal process to instruct or train a patient/client in a skill or to impart knowledge to help patients/clients voluntarily manage or modify food choices and eating behavior to maintain or improve health. Outcome: Completed/Met Date Met: 05/13/16  RD consulted for nutrition education regarding pre-diabetes.   Lab Results  Component Value Date   HGBA1C 6.3 05/08/2016    RD provided "Carbohydrate Counting for People with Diabetes" handout from the Academy of Nutrition and Dietetics. Discussed different food groups and their effects on blood sugar, emphasizing carbohydrate-containing foods. Provided list of carbohydrates and recommended serving sizes of common foods.  Discussed importance of controlled and consistent carbohydrate intake throughout the day. Provided examples of ways to balance meals/snacks and encouraged intake of high-fiber, whole grain complex carbohydrates. Discussed sugar free beverage options. Teach back method used.  Expect good compliance.  Body mass index is 31.48 kg/m. Pt meets criteria for class I obesity based on current BMI.  Current diet order is heart healthy/carbo mod, patient is consuming approximately 85-100% of meals at this time. Pt reports eating well PTA with usual consumption of at least 3 meals a day with snacks in between. Labs and medications reviewed. No further nutrition interventions warranted at this time. RD contact information provided. If additional nutrition issues arise, please re-consult RD.  Corrin Parker, MS, RD, LDN Pager # [phone] After hours/ weekend pager # [phone]

## 2016-05-14 ENCOUNTER — Other Ambulatory Visit: Payer: Self-pay | Admitting: Family Medicine

## 2016-05-14 ENCOUNTER — Telehealth: Payer: Self-pay | Admitting: Pulmonary Disease

## 2016-05-14 ENCOUNTER — Encounter (HOSPITAL_COMMUNITY): Payer: Self-pay | Admitting: Pulmonary Disease

## 2016-05-14 ENCOUNTER — Inpatient Hospital Stay (HOSPITAL_COMMUNITY): Payer: Medicare HMO

## 2016-05-14 DIAGNOSIS — R05 Cough: Secondary | ICD-10-CM

## 2016-05-14 DIAGNOSIS — J189 Pneumonia, unspecified organism: Secondary | ICD-10-CM

## 2016-05-14 DIAGNOSIS — K219 Gastro-esophageal reflux disease without esophagitis: Secondary | ICD-10-CM

## 2016-05-14 DIAGNOSIS — R1310 Dysphagia, unspecified: Secondary | ICD-10-CM

## 2016-05-14 LAB — GLUCOSE, CAPILLARY
GLUCOSE-CAPILLARY: 102 mg/dL — AB (ref 65–99)
GLUCOSE-CAPILLARY: 125 mg/dL — AB (ref 65–99)

## 2016-05-14 LAB — PROCALCITONIN: Procalcitonin: <0.1 ng/mL

## 2016-05-14 MED ORDER — AMLODIPINE BESYLATE 10 MG PO TABS: 10.0000 mg | ORAL_TABLET | Freq: Every day | ORAL | Status: DC

## 2016-05-14 MED ORDER — MOMETASONE FURO-FORMOTEROL FUM 200-5 MCG/ACT IN AERO: 2.0000 | INHALATION_SPRAY | Freq: Two times a day (BID) | RESPIRATORY_TRACT | 0 refills | Status: DC

## 2016-05-14 MED ORDER — LIDOCAINE HCL 1 % IJ SOLN
INTRAMUSCULAR | Status: AC
  Filled 2016-05-14: qty 10

## 2016-05-14 MED ORDER — DOXYCYCLINE HYCLATE 100 MG PO TABS: 100.0000 mg | ORAL_TABLET | Freq: Two times a day (BID) | ORAL | 0 refills | Status: AC

## 2016-05-14 MED ORDER — RANITIDINE HCL 150 MG PO CAPS: 150.0000 mg | ORAL_CAPSULE | Freq: Every evening | ORAL | 0 refills | Status: DC

## 2016-05-14 MED ORDER — AMLODIPINE BESYLATE 10 MG PO TABS: 10.0000 mg | ORAL_TABLET | Freq: Every day | ORAL | 0 refills | Status: AC

## 2016-05-14 MED ORDER — PREDNISONE 10 MG PO TABS: ORAL_TABLET | ORAL | 0 refills | Status: DC

## 2016-05-14 MED ORDER — IOPAMIDOL (ISOVUE-M 200) INJECTION 41%
INTRAMUSCULAR | Status: AC
  Filled 2016-05-14: qty 10

## 2016-05-14 MED ORDER — SODIUM CHLORIDE 0.9 % IJ SOLN
INTRAMUSCULAR | Status: AC
  Filled 2016-05-14: qty 10

## 2016-05-14 NOTE — Consult Note (Signed)
Name: Nichole Hunt MRN: [id number] DOB: 1940-08-01    ADMISSION DATE:  05/10/2016 CONSULTATION DATE:  05/14/2016  REFERRING MD :  Levert FeinsteinBrittany McIntyre, M.D. / FMTS  CHIEF COMPLAINT:  HCAP  BRIEF PATIENT DESCRIPTION: 76 y.o. female with a history of admission earlier in the month with bronchitis and a nonproductive cough after recent travel in December. Patient at the time endorsed subjective fever, wheezing, chills, muscle aches, headache, nausea, vomiting, diarrhea, and dyspnea. Treatment for COPD exacerbation was initiated and patient was found to be influenza B positive. Patient was tapered to oral steroids on the day of discharge per the documentation and was treated with doxycycline during her admission but this was not continued upon discharge. Additionally the patient was treated with Tamiflu. Patient also was hypoxic with ambulation initially but this had reportedly resolved. Patient readmitted with ongoing shortness of breath and worsening of cough since discharge. Patient had endorsed some nausea with emesis. PCCM was consulted with ongoing symptoms.  SIGNIFICANT EVENTS  1/01- 1/05 - Admit w/ COPD Exacerbation & Influenza B Positive 1/26 - Admit w/ Pneumonia  STUDIES:  CTA CHEST 1/26:  Personally reviewed by me. No pulmonary embolism. Minimal patchy opacification right lower and left lower lobe as well as some with and left upper lobe and lingula. Small hiatal hernia noted. No pleural effusion or thickening. No pathologic mediastinal adenopathy. No pericardial effusion.  MICROBIOLOGY: Respiratory Panel PCR 1/2:  Influenza B Positive  Influenza PCR 1/26:  Negative for A & B  ANTIBIOTICS: Zosyn 1/26 - 1/28 Vancomycin 1/26 - 1/28 Doxycycline 1/29 >>  HISTORY OF PRESENT ILLNESS:  76 y.o. female with a history of COPD. Patient was admitted earlier this month with influenza B and treated with Tamiflu. The day of discharge she was transitioned to prednisone from IV steroids.  Symptomatically the patient did worsen with return of her cough as well as progressive dyspnea, nausea, and vomiting. Patient reports she does have some mild subjective fever and sweats. Reports chest discomfort with coughing spells only. Denies any other chest pain or pressure. Dyspnea seems to be worse on exertion as well. Endorses audible wheezing. Patient does have intermittent dysphagia with chronic reflux. Denies any abdominal pain at this time. Denies any rashes or abnormal bruising at this time.  PAST MEDICAL HISTORY:  Past Medical History:  Diagnosis Date  . Chronic cough   . Diverticulitis   . Hypertension   . Influenza B   . Insomnia   . Major depression   . Vertigo     PAST SURGICAL HISTORY: Past Surgical History:  Procedure Laterality Date  . COLONOSCOPY    . EYE SURGERY      Prior to Admission medications   Medication Sig Start Date End Date Taking? Authorizing Provider  albuterol (PROVENTIL HFA;VENTOLIN HFA) 108 (90 Base) MCG/ACT inhaler Inhale 1-2 puffs into the lungs every 6 (six) hours as needed for wheezing or shortness of breath.    Yes Historical Provider, MD  amLODipine (NORVASC) 5 MG tablet Take 1 tablet (5 mg total) by mouth daily. 05/08/16  Yes Massie MaroonLachina M Hollis, FNP  atorvastatin (LIPITOR) 10 MG tablet Take 10 mg by mouth at bedtime.    Yes Historical Provider, MD  calcium carbonate (TUMS - DOSED IN MG ELEMENTAL CALCIUM) 500 MG chewable tablet Chew 1-2 tablets by mouth as needed for indigestion or heartburn.    Yes Historical Provider, MD  citalopram (CELEXA) 20 MG tablet Take 20 mg by mouth daily.   Yes Historical Provider, MD  fluticasone (FLOVENT HFA) 220 MCG/ACT inhaler Inhale 2 puffs into the lungs 2 (two) times daily.   Yes Historical Provider, MD  guaiFENesin (MUCINEX) 600 MG 12 hr tablet Take 1,200 mg by mouth 2 (two) times daily as needed for cough or to loosen phlegm.   Yes Historical Provider, MD  ipratropium-albuterol (DUONEB) 0.5-2.5 (3) MG/3ML SOLN  Take 3 mLs by nebulization every 6 (six) hours as needed (for wheezing/shortness of breath).   Yes Historical Provider, MD  levofloxacin (LEVAQUIN) 750 MG tablet Take 1 tablet (750 mg total) by mouth daily. 05/08/16  Yes Praveen Mannam, MD  metFORMIN (GLUCOPHAGE) 500 MG tablet Take 1 tablet (500 mg total) by mouth 2 (two) times daily with a meal. 05/08/16  Yes Massie Maroon, FNP  pantoprazole (PROTONIX) 40 MG tablet Take 1 tablet (40 mg total) by mouth daily. 04/20/16  Yes Leana Roe Elgergawy, MD  prazosin (MINIPRESS) 2 MG capsule Take 2 mg by mouth at bedtime.   Yes Historical Provider, MD  traZODone (DESYREL) 50 MG tablet Take 50 mg by mouth at bedtime.   Yes Historical Provider, MD  doxycycline (VIBRA-TABS) 100 MG tablet Take 1 tablet (100 mg total) by mouth every 12 (twelve) hours. 05/13/16 05/19/16  Wendee Beavers, DO  predniSONE (DELTASONE) 50 MG tablet Take 1 tablet (50 mg total) by mouth daily with breakfast. 05/13/16 05/14/16  Wendee Beavers, DO  tiotropium (SPIRIVA) 18 MCG inhalation capsule Place 1 capsule (18 mcg total) into inhaler and inhale daily. 05/14/16   Wendee Beavers, DO   No Known Allergies  FAMILY HISTORY:  Family History  Problem Relation Age of Onset  . Alcoholism Mother   . Alcoholism Father   . Heart attack Father   . Diabetes Sister   . Heart attack Sister   . Hypertension Sister   . Stroke Sister   . Asthma Son    SOCIAL HISTORY: Social History   Social History  . Marital status: Single    Spouse name: N/A  . Number of children: N/A  . Years of education: N/A   Social History Main Topics  . Smoking status: Former Smoker    Packs/day: 3.00    Types: Cigarettes    Quit date: 04/15/1976  . Smokeless tobacco: Never Used  . Alcohol use No  . Drug use: No  . Sexual activity: No   Other Topics Concern  . None   Social History Narrative    Pulmonary (05/14/16):   Patient travels between [place], [place], [place], and [place] staying  with her children to visit grandchildren. Previously worked in Social worker. Positive mold exposure in [place] which was reportedly professionally remediated. Also does have significant indoor bird exposure through a parrot and [place] in [place] and [place].    REVIEW OF SYSTEMS: No new joint pain or erythema. No dysuria or hematuria. A pertinent 14 point review of systems is negative except as per the history of presenting illness.  SUBJECTIVE: As above.  VITAL SIGNS: Temp:  [97.7 F (36.5 C)-98.2 F (36.8 C)] 97.7 F (36.5 C) (01/30 0504) Pulse Rate:  [64-88] 64 (01/30 0504) Resp:  [18] 18 (01/30 0504) BP: (125-164)/(65-81) 164/81 (01/30 0504) SpO2:  [94 %-97 %] 95 % (01/30 0504)  PHYSICAL EXAMINATION: General:  No acute distress. No family at bedside.  Integument:  Warm & dry. No rash on exposed skin.  Extremities:  No cyanosis or clubbing.  Lymphatics:  No appreciated cervical or supraclavicular lymphadenoapthy. HEENT:  Moist mucus membranes.  No oral ulcers. No scleral injection or icterus.  Cardiovascular:  Regular rate. No edema. Normal S1 & S2. Pulmonary:  Good aeration & clear to auscultation bilaterally. Symmetric chest wall expansion. No accessory muscle use on room air. Abdomen: Soft. Normal bowel sounds. Nondistended.  Musculoskeletal:  Normal bulk and tone. Hand grip strength 5/5 bilaterally. No joint deformity or effusion appreciated. Neurological:  CN 2-12 grossly in tact. No meningismus. Moving all 4 extremities equally.  Psychiatric:  Mood and affect congruent. Speech normal rhythm, rate & tone.    Recent Labs Lab 05/11/16 0538 05/12/16 0524 05/13/16 0529  NA 139 139 139  K 3.7 3.7 3.5  CL 110 109 110  CO2 25 25 24   BUN 7 7 9   CREATININE 0.95 0.92 0.93  GLUCOSE 98 135* 104*    Recent Labs Lab 05/11/16 0538 05/12/16 0524 05/13/16 0529  HGB 10.8* 10.9* 11.0*  HCT 33.0* 32.7* 33.4*  WBC 9.1 9.3 9.3  PLT 172 200 236   No results found.  ASSESSMENT /  PLAN:  76 y.o. female with chronic cough admitted with pneumonia associated with influenza B. Patient also has symptoms of dysphagia and with underlying reflux and hiatal hernia believe this should be investigated further as a possible cause for her cough. Certainly COPD is of concern with her history of tobacco use. I did spend a significant amount time today educating the patient on the correlation between reflux and cough.  1. HCAP:  Agree with Doxycycline for now. Can likely complete a total of 10 days of antibiotics empirically. 2. Cardiac cough/possible reactive airways disease: Will need outpatient pulmonary function testing. Recommend a slow prednisone taper. Agree with continuing Dulera for now. Recommend holding off on Spiriva while awaiting pulmonary function testing. Patient should be discharged to home with a nebulizer to use as needed with albuterol. 3. Dysphagia/GERD: Continuing patient on Protonix. Ordered esophagogram and SCL 70.  4. Follow-up: Patient already scheduled for follow-up appointment in our office.   Remainder of care as per primary service.  Donna Christen Jamison Neighbor, M.D. [facility] Pager:  [phone] After 3pm or if no response, call [phone] 05/14/2016, 6:34 AM

## 2016-05-14 NOTE — Telephone Encounter (Signed)
Please let her know that there is no indication for a bronchoscopy. Ok to use Lebanondulera and spiriva till she sees me. Stop the symbicort Please check if the PFTs and clinic visit can be moved up to the next available.  Thanks PM

## 2016-05-14 NOTE — Discharge Instructions (Signed)
You were admitted for a viral pneumonia and COPD exacerbation. Take your prednisone 50 mg daily for 1 more day, then decrease to 30 mg daily for 3 days, then 10 mg for 3 days until completed. Take your doxycycline 100 mg twice daily for 5 more day upon discharge. We have added a new medication called Breo-Ellipta which you will take daily as a controller medication. Your pulmonologist will follow-up with you outpatient. You can use tea and honey to help with your cough.   Chronic Obstructive Pulmonary Disease Exacerbation Chronic obstructive pulmonary disease (COPD) is a common lung problem. In COPD, the flow of air from the lungs is limited. COPD exacerbations are times that breathing gets worse and you need extra treatment. Without treatment they can be life threatening. If they happen often, your lungs can become more damaged. If your COPD gets worse, your doctor may treat you with:  Medicines.  Oxygen.  Different ways to clear your airway, such as using a mask. Follow these instructions at home:  Do not smoke.  Avoid tobacco smoke and other things that bother your lungs.  If given, take your antibiotic medicine as told. Finish the medicine even if you start to feel better.  Only take medicines as told by your doctor.  Drink enough fluids to keep your pee (urine) clear or pale yellow (unless your doctor has told you not to).  Use a cool mist machine (vaporizer).  If you use oxygen or a machine that turns liquid medicine into a mist (nebulizer), continue to use them as told.  Keep up with shots (vaccinations) as told by your doctor.  Exercise regularly.  Eat healthy foods.  Keep all doctor visits as told. Get help right away if:  You are very short of breath and it gets worse.  You have trouble talking.  You have bad chest pain.  You have blood in your spit (sputum).  You have a fever.  You keep throwing up (vomiting).  You feel weak, or you pass out (faint).  You  feel confused.  You keep getting worse. This information is not intended to replace advice given to you by your health care provider. Make sure you discuss any questions you have with your health care provider. Document Released: 03/21/2011 Document Revised: 09/07/2015 Document Reviewed: 12/04/2012 Elsevier Interactive Patient Education  2017 [employer]

## 2016-05-14 NOTE — Progress Notes (Signed)
Family Medicine Teaching Service Daily Progress Note Intern Pager: [phone]  Patient name: Nichole Hunt Medical record number: [id number] Date of birth: Mar 18, 1941 Age: 76 y.o. Gender: female  Primary Care Provider: Dorena Dew, FNP Consultants: None Code Status: Full  Pt Overview and Major Events to Date:  01/26: Admit for HAP/COPD exacerbation in the context of positive influenza 01/29: Transition to oral antibiotic  Assessment and Plan: [name] is a 76 y.o. female presenting with cough. PMH is significant for COPD, prediabetes, hypertension, PTSD  #Productive cough with dyspnea: Acute, improved. Likely due to COPD exacerbation in the setting of pneumonia. She has some hemoptysis on admission but CTA negative for PE. Heart failure unlikely given without signs of fluid overload. Lung exam with minimal rhonchi and expiratory wheeze bilaterally suggestive for COPD exacerbation. She denies exertional chest pain to think of ACS. HEART score 3. Troponin negative. CT chest showed small hiatal hernia. Could complicate GERD causing chronic cough. --COPD as below --Pneumonia as below --Pantoprazole 40 mg QD --Incentive spirometry --Continue home Mucinex --Oxygen as needed to keep saturation greater than 90% --Esophogram and SCL 70 pending per Pulm recs  #COPD class D: Chronic, improved. Presented with exacerbation. Second admission in a month. High CAT score. On DuoNeb nebulizer, Symbicort at home. Exam with rhonchorous and expiratory wheeze bilaterally. No increased work of breathing. Wondering if GERD is playing a role here.  --Prednisone 50 mg QD (day 4, 1/27>) --Antibiotic as below --Dulera QD, will need this on d/c, hold Spriva on d/c until pulm f/u --Pulm to perform PFTs outpatient --DuoNeb nebulizer q6h, will need neb on d/c --Protonix as above  #Healthcare acquired pneumonia: Acute, improved. CTA chest with LLL pneumonia, RLL and LUL opacities concerning for pneumonia.  PSI 78 including subjective fever. Given recent hospitalizations, recent antibiotics (Levaquin 2 days ago by her pulmonologist), and underlying COPD, will treat with broad-spectrum antibiotic as below. Procalcitonin trending down, 0.41>0.35. --Transitioning IV Zosyn and vancomycin (3 days) to PO doxycycline (day 2 of 71/29>), for a completion of 10 days --Procalcitonin --Incentive spirometry  #Hypertension: Normotensive on admission.  --Continue amlodipine  #PTSD/Insomnia: Citalopram, trazodone and prazosin at home --Continue home medications  #Prediabetes: Last A1c 6.3 three weeks ago. On metformin at home --Sensitive sliding scale --Hold home metformin  FEN/GI: Carb modified diet, PPI PPx: Lovenox SQ  Disposition: To be discharged today  Subjective:  Patient overall doing better this morning. States she feels ready to go home. Asking about her medications and how to take them. Patient denies symptoms of fevers or chills, chest pain, dyspnea, palpitations, nausea or vomiting.  Objective: Temp:  [97.7 F (36.5 C)-98.2 F (36.8 C)] 97.7 F (36.5 C) (01/30 0504) Pulse Rate:  [64-88] 64 (01/30 0504) Resp:  [18] 18 (01/30 0504) BP: (125-164)/(65-81) 164/81 (01/30 0504) SpO2:  [94 %-97 %] 95 % (01/30 0504) Physical Exam: General: well nourished, well developed, in no acute distress with non-toxic appearance HEENT: normocephalic, atraumatic, moist mucous membranes CV: regular rate and rhythm without murmurs, rubs, or gallops Lungs: minimal expiratory wheeze bilaterally with normal work of breathing on RA Abdomen: soft, non-tender, normoactive bowel sounds Skin: warm, dry, no rashes or lesions, cap refill < 2 seconds Extremities: warm and well perfused, normal tone  Laboratory:  Recent Labs Lab 05/11/16 0538 05/12/16 0524 05/13/16 0529  WBC 9.1 9.3 9.3  HGB 10.8* 10.9* 11.0*  HCT 33.0* 32.7* 33.4*  PLT 172 200 236    Recent Labs Lab 05/08/16 1110  05/10/16 1326  05/11/16 0538  05/12/16 0524 05/13/16 0529  NA 138  --  138 139 139 139  K 4.0  --  3.9 3.7 3.7 3.5  CL 103  --  106 110 109 110  CO2 25  --  21* _0 BUN 11  --  _1 CREATININE 1.06*  < > 1.05* 0.95 0.92 0.93  CALCIUM 10.2  --  9.0 8.2* 8.8* 8.8*  PROT 6.3  --  6.6  --   --   --   BILITOT 0.5  --  0.7  --   --   --   ALKPHOS 57  --  50  --   --   --   ALT 23  --  25  --   --   --   AST 25  --  26  --   --   --   GLUCOSE 118*  --  124* 98 135* 104*  < > = values in this interval not displayed.  Urinalysis    Component Value Date/Time   COLORURINE STRAW (A) 05/10/2016 1955   APPEARANCEUR CLEAR 05/10/2016 1955   LABSPEC >1.046 (H) 05/10/2016 1955   PHURINE 5.0 05/10/2016 1955   GLUCOSEU NEGATIVE 05/10/2016 1955   HGBUR SMALL (A) 05/10/2016 1955   BILIRUBINUR NEGATIVE 05/10/2016 1955   KETONESUR 20 (A) 05/10/2016 1955   PROTEINUR NEGATIVE 05/10/2016 1955   UROBILINOGEN 0.2 05/08/2016 1122   NITRITE NEGATIVE 05/10/2016 1955   LEUKOCYTESUR TRACE (A) 05/10/2016 1955   Lipase: 22 ESR: 21 Lactic acid: 1.08>0.42 Influenza: Negative BNP: 42.9 Troponin: Negative Procalcitonin: 0.41>0.35 INR: 1.05 SCL 70: Pending  Imaging/Diagnostic Tests: CT Angio Chest PE W and/or Wo Contrast (05/10/2016) FINDINGS: Cardiovascular:  There is no evidence of pulmonary embolus.  The heart is borderline normal in size. Minimal calcification is seen along the aortic arch and descending thoracic aorta. The great vessels are unremarkable in appearance.  Mediastinum/Nodes: Trace pericardial fluid likely remains within normal limits. No mediastinal lymphadenopathy is seen. The thyroid gland is unremarkable. No axillary lymphadenopathy is appreciated.  Lungs/Pleura: Hazy airspace opacification is noted within the left lower lobe, compatible with pneumonia. Additional minimal opacities are seen within the right lower lobe and left upper lobe. The lungs are otherwise grossly clear. No  pleural effusion or pneumothorax is seen. No dominant mass is identified.  Upper Abdomen: The visualized portions of the liver and spleen are unremarkable. A small hiatal hernia is noted.  Musculoskeletal: No acute osseous abnormalities are identified. Degenerative change is noted along the lower cervical spine. The visualized musculature is unremarkable in appearance.  Review of the MIP images confirms the above findings.  IMPRESSION: 1. No evidence of pulmonary embolus. 2. Hazy airspace opacification within the left lower lung lobe, compatible with pneumonia. Additional minimal airspace opacities within the right lower lobe and left upper lobe.  3. Small hiatal hernia noted.     [place] Bing, DO 05/14/2016, 7:07 AM PGY-1, [place] Intern pager: [phone], text pages welcome

## 2016-05-14 NOTE — Telephone Encounter (Signed)
Spoke with the pt's daughter and notified of recs  She verbalized understanding  Nothing further needed

## 2016-05-14 NOTE — Care Management Note (Signed)
Case Management Note  Patient Details  Name: Nichole Hunt MRN: [id number] Date of Birth: 1940-10-19  Subjective/Objective:                [facility] Lupita LeashDonna, as CM noted patient was referred with last DC. Patient's [facility] at this [facility]. No additional DME needs identified.    Action/Plan:  Anticipate DC to home today.  Expected Discharge Date:  05/14/16               Expected Discharge Plan:  Home/Self Care  In-House Referral:     Discharge planning Services  CM Consult  Post Acute Care Choice:    Choice offered to:     DME Arranged:    DME Agency:     HH Arranged:    HH Agency:     Status of Service:  Completed, signed off  If discussed at [employer] Length of Stay Meetings, dates discussed:    Additional Comments:  Lawerance SabalDebbie Danille Oppedisano, RN 05/14/2016, 2:34 PM

## 2016-05-14 NOTE — Progress Notes (Signed)
Bronson IngPamela Boline to be D/C'd Home per MD order.  Discussed prescriptions and follow up appointments with the patient. Prescriptions given to patient, medication list explained in detail. Pt verbalized understanding.  Allergies as of 05/14/2016   No Known Allergies     Medication List    STOP taking these medications   levofloxacin 750 MG tablet Commonly known as:  LEVAQUIN     TAKE these medications   albuterol 108 (90 Base) MCG/ACT inhaler Commonly known as:  PROVENTIL HFA;VENTOLIN HFA Inhale 1-2 puffs into the lungs every 6 (six) hours as needed for wheezing or shortness of breath.   amLODipine 10 MG tablet Commonly known as:  NORVASC Take 1 tablet (10 mg total) by mouth daily. Start taking on:  05/15/2016 What changed:  medication strength  how much to take   atorvastatin 10 MG tablet Commonly known as:  LIPITOR Take 10 mg by mouth at bedtime.   calcium carbonate 500 MG chewable tablet Commonly known as:  TUMS - dosed in mg elemental calcium Chew 1-2 tablets by mouth as needed for indigestion or heartburn.   citalopram 20 MG tablet Commonly known as:  CELEXA Take 20 mg by mouth daily.   doxycycline 100 MG tablet Commonly known as:  VIBRA-TABS Take 1 tablet (100 mg total) by mouth every 12 (twelve) hours.   fluticasone 220 MCG/ACT inhaler Commonly known as:  FLOVENT HFA Inhale 2 puffs into the lungs 2 (two) times daily.   guaiFENesin 600 MG 12 hr tablet Commonly known as:  MUCINEX Take 1,200 mg by mouth 2 (two) times daily as needed for cough or to loosen phlegm.   ipratropium-albuterol 0.5-2.5 (3) MG/3ML Soln Commonly known as:  DUONEB Take 3 mLs by nebulization every 6 (six) hours as needed (for wheezing/shortness of breath).   metFORMIN 500 MG tablet Commonly known as:  GLUCOPHAGE Take 1 tablet (500 mg total) by mouth 2 (two) times daily with a meal.   mometasone-formoterol 200-5 MCG/ACT Aero Commonly known as:  DULERA Inhale 2 puffs into the lungs 2 (two)  times daily.   pantoprazole 40 MG tablet Commonly known as:  PROTONIX Take 1 tablet (40 mg total) by mouth daily.   prazosin 2 MG capsule Commonly known as:  MINIPRESS Take 2 mg by mouth at bedtime.   predniSONE 10 MG tablet Commonly known as:  DELTASONE Take 5 tabs daily for 1 day, then 3 tabs daily for 3 days, then 1 tab daily for 3 days.   traZODone 50 MG tablet Commonly known as:  DESYREL Take 50 mg by mouth at bedtime.       Vitals:   05/14/16 0504 05/14/16 1000  BP: (!) 164/81 (!) 171/66  Pulse: 64 (!) 57  Resp: 18 18  Temp: 97.7 F (36.5 C) 98.6 F (37 C)    Skin clean, dry and intact without evidence of skin break down, no evidence of skin tears noted. IV catheter discontinued intact. Site without signs and symptoms of complications. Dressing and pressure applied. Pt denies pain at this time. No complaints noted.  An After Visit Summary was printed and given to the patient. Patient escorted via WC, and D/C home via private auto.  Nelma RothmanNatalie Maximo Spratling, RN [facility] Phone [phone]

## 2016-05-14 NOTE — Telephone Encounter (Signed)
Spoke with pt's daughter in law, who states pt is currently admitted but is now being discharged.  Pt's daughter in law wanted to know if Dr. Isaiah SergeMannam could perform a bronch while pt was admitted, Mykel states she was not aware that pt would be discharged today. Pt is scheduled for a pft on 05-28-16 and f/u with PM on 06-10-16. Mykel wanted to know if pt should be seen sooner. Pt is being d/c with dulera 200 & spiriva handihaler, pt's currently has symbicort samples that were provided pt's last OV with PM. at and would like to know if she should continue both.   PM please advise. Thanks.

## 2016-05-15 LAB — ANTI-SCLERODERMA ANTIBODY

## 2016-05-19 ENCOUNTER — Telehealth: Payer: Self-pay | Admitting: Pulmonary Disease

## 2016-05-19 NOTE — Telephone Encounter (Signed)
[facility]. I suspect that this ankle swelling is related to pulmonary hypertension/R heart failure from her severe COPD vs recent Amlodipine addition to her medications vs both. So is not more SOB than normal. Instructed her to call the office for a follow up visit in the AM. Should she become more SOB, she should go to the emergency department for further evaluation and possible hospital admission.

## 2016-05-28 ENCOUNTER — Other Ambulatory Visit: Payer: Self-pay | Admitting: Pulmonary Disease

## 2016-05-28 ENCOUNTER — Ambulatory Visit: Payer: Medicare HMO | Admitting: Family Medicine

## 2016-05-28 DIAGNOSIS — R0600 Dyspnea, unspecified: Secondary | ICD-10-CM

## 2016-05-31 ENCOUNTER — Ambulatory Visit: Payer: Medicare HMO | Admitting: Adult Health

## 2016-06-03 ENCOUNTER — Ambulatory Visit (INDEPENDENT_AMBULATORY_CARE_PROVIDER_SITE_OTHER): Payer: 59 | Admitting: Adult Health

## 2016-06-03 ENCOUNTER — Ambulatory Visit (HOSPITAL_COMMUNITY)
Admission: RE | Admit: 2016-06-03 | Discharge: 2016-06-03 | Disposition: A | Discharge status: Home / Self Care | Payer: 59 | Source: Ambulatory Visit | Attending: Pulmonary Disease | Admitting: Pulmonary Disease

## 2016-06-03 ENCOUNTER — Encounter: Payer: Self-pay | Admitting: Adult Health

## 2016-06-03 ENCOUNTER — Ambulatory Visit (INDEPENDENT_AMBULATORY_CARE_PROVIDER_SITE_OTHER)
Admission: RE | Admit: 2016-06-03 | Discharge: 2016-06-03 | Disposition: A | Discharge status: Home / Self Care | Payer: Medicare HMO | Source: Ambulatory Visit | Attending: Adult Health | Admitting: Adult Health

## 2016-06-03 VITALS — BP 120/62 | HR 94 | Ht 64.0 in | Wt 179.6 lb

## 2016-06-03 DIAGNOSIS — J189 Pneumonia, unspecified organism: Secondary | ICD-10-CM | POA: Diagnosis not present

## 2016-06-03 DIAGNOSIS — R0600 Dyspnea, unspecified: Secondary | ICD-10-CM | POA: Diagnosis not present

## 2016-06-03 DIAGNOSIS — J441 Chronic obstructive pulmonary disease with (acute) exacerbation: Secondary | ICD-10-CM

## 2016-06-03 LAB — PULMONARY FUNCTION TEST
DL/VA % pred: 72 %
DL/VA: 3.46 ml/min/mmHg/L
DLCO UNC % PRED: 70 %
DLCO UNC: 17 ml/min/mmHg
FEF 25-75 PRE: 2.18 L/s
FEF 25-75 Post: 2.16 L/sec
FEF2575-%CHANGE-POST: 0 %
FEF2575-%PRED-PRE: 125 %
FEF2575-%Pred-Post: 124 %
FEV1-%Change-Post: 0 %
FEV1-%PRED-POST: 112 %
FEV1-%Pred-Pre: 112 %
FEV1-POST: 2.43 L
FEV1-Pre: 2.42 L
FEV1FVC-%Change-Post: 4 %
FEV1FVC-%Pred-Pre: 103 %
FEV6-%CHANGE-POST: -1 %
FEV6-%PRED-POST: 109 %
FEV6-%PRED-PRE: 110 %
FEV6-PRE: 3.03 L
FEV6-Post: 3 L
FEV6FVC-%CHANGE-POST: 2 %
FEV6FVC-%PRED-POST: 105 %
FEV6FVC-%PRED-PRE: 102 %
FVC-%CHANGE-POST: -3 %
FVC-%Pred-Post: 104 %
FVC-%Pred-Pre: 108 %
FVC-Post: 3 L
FVC-Pre: 3.11 L
POST FEV6/FVC RATIO: 100 %
PRE FEV1/FVC RATIO: 78 %
Post FEV1/FVC ratio: 81 %
Pre FEV6/FVC Ratio: 97 %
RV % PRED: 90 %
RV: 2.04 L
TLC % PRED: 104 %
TLC: 5.28 L

## 2016-06-03 MED ORDER — ALBUTEROL SULFATE (2.5 MG/3ML) 0.083% IN NEBU
2.5000 mg | INHALATION_SOLUTION | Freq: Once | RESPIRATORY_TRACT | Status: AC
  Administered 2016-06-03: 2.5 mg via RESPIRATORY_TRACT

## 2016-06-03 MED ORDER — PANTOPRAZOLE SODIUM 40 MG PO TBEC: 40.0000 mg | DELAYED_RELEASE_TABLET | Freq: Every day | ORAL | 5 refills | Status: AC

## 2016-06-03 MED ORDER — BUDESONIDE-FORMOTEROL FUMARATE 160-4.5 MCG/ACT IN AERO: 2.0000 | INHALATION_SPRAY | Freq: Two times a day (BID) | RESPIRATORY_TRACT | 5 refills | Status: AC

## 2016-06-03 NOTE — Progress Notes (Signed)
@Patient  ID: Nichole Hunt, female    DOB: [birth year], 76 y.o.   MRN: [id number]  Chief Complaint  Patient presents with  . Follow-up    COPD/PFT     Referring provider: Massie Maroon, FNP  HPI: 76 yo female former smoker with bronchitis seen for pulmonary consult 04/26/16 after hospitalization with COPD exacerbation and Influenza B .   TEST  1/26 CTA neg for PE, LLL aspdaz , RLL opacities  CXR 2/19 w/ LLL aspdz  Barium swallow esophageal dismotilkty , mod HH , spont reflux   06/03/16 Post Hospital follow up  Pt returns for post hospital follow up for COPD exacerbation and HCAP w/ bilateral PNA on CT chest . She was treated with IV abx , steroids and nebs. Barium swallow did show esophageal dysmotility , mod HH and spont reflux. Since discharge she is feeling better . Still weak and low energy.  She was admitted earlier last month for Influenza .  PFT done today ,2/19 showed normal lung function w/ minimal diffucing defect. FEV1 112%, ratio 81, FVC 104%, no BD , TLC 104%, DLCO 70%.  She is on BREO but wants to change back to Symbicort that she was on previously . She travels between her kids [place], [place] and [place] . She is going to [place] soon and will be back in few months.  She denies n/v/d. hemopytiss or chest pain.  She is on Protonix now and we discussed swallow precautions.    No Known Allergies  Immunization History  Administered Date(s) Administered  . Influenza-Unspecified 02/14/2016    Past Medical History:  Diagnosis Date  . Chronic cough   . Diverticulitis   . Hypertension   . Influenza B   . Insomnia   . Major depression   . Vertigo     Tobacco History: History  Smoking Status  . Former Smoker  . Packs/day: 3.00  . Types: Cigarettes  . Quit date: 04/15/1976  Smokeless Tobacco  . Never Used   Counseling given: Not Answered   Outpatient Encounter Prescriptions as of 06/03/2016  Medication Sig  . albuterol (PROVENTIL HFA;VENTOLIN HFA) 108 (90  Base) MCG/ACT inhaler Inhale 1-2 puffs into the lungs every 6 (six) hours as needed for wheezing or shortness of breath.   Marland Kitchen amLODipine (NORVASC) 10 MG tablet Take 1 tablet (10 mg total) by mouth daily.  Marland Kitchen atorvastatin (LIPITOR) 10 MG tablet Take 10 mg by mouth at bedtime.   . budesonide-formoterol (SYMBICORT) 160-4.5 MCG/ACT inhaler Inhale 2 puffs into the lungs 2 (two) times daily.  . calcium carbonate (TUMS - DOSED IN MG ELEMENTAL CALCIUM) 500 MG chewable tablet Chew 1-2 tablets by mouth as needed for indigestion or heartburn.   . citalopram (CELEXA) 20 MG tablet Take 20 mg by mouth daily.  . fluticasone furoate-vilanterol (BREO ELLIPTA) 100-25 MCG/INH AEPB Inhale 1 puff into the lungs daily.  Marland Kitchen ipratropium-albuterol (DUONEB) 0.5-2.5 (3) MG/3ML SOLN Take 3 mLs by nebulization every 6 (six) hours as needed (for wheezing/shortness of breath).  . metFORMIN (GLUCOPHAGE) 500 MG tablet Take 1 tablet (500 mg total) by mouth 2 (two) times daily with a meal.  . pantoprazole (PROTONIX) 40 MG tablet Take 1 tablet (40 mg total) by mouth daily.  . predniSONE (DELTASONE) 10 MG tablet Take 5 tabs daily for 1 day, then 3 tabs daily for 3 days, then 1 tab daily for 3 days.  . traZODone (DESYREL) 50 MG tablet Take 50 mg by mouth at bedtime.  . [DISCONTINUED]  fluticasone (FLOVENT HFA) 220 MCG/ACT inhaler Inhale 2 puffs into the lungs 2 (two) times daily.  . [DISCONTINUED] guaiFENesin (MUCINEX) 600 MG 12 hr tablet Take 1,200 mg by mouth 2 (two) times daily as needed for cough or to loosen phlegm.  . [DISCONTINUED] mometasone-formoterol (DULERA) 200-5 MCG/ACT AERO Inhale 2 puffs into the lungs 2 (two) times daily.  . prazosin (MINIPRESS) 2 MG capsule Take 2 mg by mouth at bedtime.  . ranitidine (ZANTAC) 150 MG capsule Take 1 capsule (150 mg total) by mouth every evening. (Patient not taking: Reported on 06/03/2016)   No facility-administered encounter medications on file as of 06/03/2016.      Review of  Systems  Constitutional:   No  weight loss, night sweats,  Fevers, chills,  +fatigue, or  lassitude.  HEENT:   No headaches,  Difficulty swallowing,  Tooth/dental problems, or  Sore throat,                No sneezing, itching, ear ache, + nasal congestion, post nasal drip,   CV:  No chest pain,  Orthopnea, PND, swelling in lower extremities, anasarca, dizziness, palpitations, syncope.   GI  No heartburn, indigestion, abdominal pain, nausea, vomiting, diarrhea, change in bowel habits, loss of appetite, bloody stools.   Resp:   No chest wall deformity  Skin: no rash or lesions.  GU: no dysuria, change in color of urine, no urgency or frequency.  No flank pain, no hematuria   MS:  No joint pain or swelling.  No decreased range of motion.  No back pain.    Physical Exam  BP 120/62 (BP Location: Right Arm, Cuff Size: Normal)   Pulse 94   Ht 5\' 4"  (1.626 m)   Wt 81.5 kg (179 lb 9.6 oz)   SpO2 95%   BMI 30.83 kg/m   GEN: A/Ox3; pleasant , NAD, elderly    HEENT:  [place]/AT,  EACs-clear, TMs-wnl, NOSE-clear, THROAT-clear, no lesions, no postnasal drip or exudate noted.   NECK:  Supple w/ fair ROM; no JVD; normal carotid impulses w/o bruits; no thyromegaly or nodules palpated; no lymphadenopathy.    RESP  Clear  P & A; w/o, wheezes/ rales/ or rhonchi. no accessory muscle use, no dullness to percussion  CARD:  RRR, no m/r/g, no peripheral edema, pulses intact, no cyanosis or clubbing.  GI:   Soft & nt; nml bowel sounds; no organomegaly or masses detected.   Musco: Warm bil, no deformities or joint swelling noted.   Neuro: alert, no focal deficits noted.    Skin: Warm, no lesions or rashes    Lab Results:  CBC    Component Value Date/Time   WBC 9.3 05/13/2016 0529   RBC 3.67 (L) 05/13/2016 0529   HGB 11.0 (L) 05/13/2016 0529   HCT 33.4 (L) 05/13/2016 0529   PLT 236 05/13/2016 0529   MCV 91.0 05/13/2016 0529   MCH 30.0 05/13/2016 0529   MCHC 32.9 05/13/2016 0529   RDW  13.6 05/13/2016 0529   LYMPHSABS 2.0 04/15/2016 1934   MONOABS 0.8 04/15/2016 1934   EOSABS 0.0 04/15/2016 1934   BASOSABS 0.0 04/15/2016 1934    BMET    Component Value Date/Time   NA 139 05/13/2016 0529   K 3.5 05/13/2016 0529   CL 110 05/13/2016 0529   CO2 24 05/13/2016 0529   GLUCOSE 104 (H) 05/13/2016 0529   BUN 9 05/13/2016 0529   CREATININE 0.93 05/13/2016 0529   CREATININE 1.06 (H) 05/08/2016 1110  CALCIUM 8.8 (L) 05/13/2016 0529   GFRNONAA 60 (L) 05/13/2016 0529   GFRNONAA 52 (L) 05/08/2016 1110   GFRAA >60 05/13/2016 0529   GFRAA 60 05/08/2016 1110    BNP    Component Value Date/Time   BNP 42.9 05/10/2016 2144    ProBNP No results found for: PROBNP  Imaging: Dg Chest 2 View  Result Date: 05/08/2016 CLINICAL DATA:  Chronic cough. EXAM: CHEST  2 VIEW COMPARISON:  04/26/2016 . FINDINGS: Mediastinum and hilar structures are normal. Lung volumes with mild basilar atelectasis and/or scarring. No pleural effusion or pneumothorax. Mild cardiomegaly with normal pulmonary vascularity. No acute bony abnormality. Degenerative changes thoracic spine. Diffuse osteopenia . IMPRESSION: Low lung volumes with mild basilar atelectasis and/or scarring. No acute cardiopulmonary disease. Electronically Signed   By: Maisie Fus  Register   On: 05/08/2016 12:13   Ct Angio Chest Pe W And/or Wo Contrast  Result Date: 05/10/2016 CLINICAL DATA:  Subacute onset of cough, shortness of breath, vomiting and hemoptysis. Initial encounter. EXAM: CT ANGIOGRAPHY CHEST WITH CONTRAST TECHNIQUE: Multidetector CT imaging of the chest was performed using the standard protocol during bolus administration of intravenous contrast. Multiplanar CT image reconstructions and MIPs were obtained to evaluate the vascular anatomy. CONTRAST:  100 mL of Isovue 370 IV contrast COMPARISON:  Chest radiograph performed 05/08/2016 FINDINGS: Cardiovascular:  There is no evidence of pulmonary embolus. The heart is borderline  normal in size. Minimal calcification is seen along the aortic arch and descending thoracic aorta. The great vessels are unremarkable in appearance. Mediastinum/Nodes: Trace pericardial fluid likely remains within normal limits. No mediastinal lymphadenopathy is seen. The thyroid gland is unremarkable. No axillary lymphadenopathy is appreciated. Lungs/Pleura: Hazy airspace opacification is noted within the left lower lobe, compatible with pneumonia. Additional minimal opacities are seen within the right lower lobe and left upper lobe. The lungs are otherwise grossly clear. No pleural effusion or pneumothorax is seen. No dominant mass is identified. Upper Abdomen: The visualized portions of the liver and spleen are unremarkable. A small hiatal hernia is noted. Musculoskeletal: No acute osseous abnormalities are identified. Degenerative change is noted along the lower cervical spine. The visualized musculature is unremarkable in appearance. Review of the MIP images confirms the above findings. IMPRESSION: 1. No evidence of pulmonary embolus. 2. Hazy airspace opacification within the left lower lung lobe, compatible with pneumonia. Additional minimal airspace opacities within the right lower lobe and left upper lobe. 3. Small hiatal hernia noted. Electronically Signed   By: Roanna Raider M.D.   On: 05/10/2016 18:17   Dg Esophagus  Result Date: 05/14/2016 CLINICAL DATA:  Intermittent dysphasia, chronic reflux. Chronic bronchitis. EXAM: ESOPHOGRAM / BARIUM SWALLOW / BARIUM TABLET STUDY TECHNIQUE: Combined double contrast and single contrast examination performed using effervescent crystals, thick barium liquid, and thin barium liquid. The patient was observed with fluoroscopy swallowing a 13 mm barium sulphate tablet. FLUOROSCOPY TIME:  Fluoroscopy Time:  1 minutes and 48 seconds Radiation Exposure Index (if provided by the fluoroscopic device): 31.0 mGy Number of Acquired Spot Images: 0 COMPARISON:  Chest CT  05/10/2016 FINDINGS: Initial barium swallows demonstrate normal pharyngeal motion with swallowing. No laryngeal penetration or aspiration. No upper esophageal webs, strictures or diverticuli. Nonspecific esophageal dysmotility with disruption of the primary peristaltic wave and frequent tertiary contractions. No intrinsic or extrinsic lesions of the esophagus. There is a moderate-sized sliding-type hiatal hernia and episodes of spontaneous GE reflux. There is a lower esophageal mucosal ring but this is widely patent. The 13 mm barium  tablet passes through without difficulty. IMPRESSION: 1. Esophageal dysmotility. 2. Moderate-sized sliding-type hiatal hernia with episodes of spontaneous GE reflux. 3. Widely patent lower esophageal mucosal ring. Electronically Signed   By: Rudie Meyer M.D.   On: 05/14/2016 10:23     Assessment & Plan:   No problem-specific Assessment & Plan notes found for this encounter.     Rubye Oaks, NP 06/03/2016

## 2016-06-03 NOTE — Patient Instructions (Addendum)
Chest xray today .  Continue on Protonix 40mg  daily  Stop BREO .  Continue on Symbicort 2 puffs Twice daily, rinses after use.  Follow up Dr. Isaiah SergeMannam in 3 months and As needed   [place].

## 2016-06-06 ENCOUNTER — Ambulatory Visit: Payer: Medicare Other | Admitting: Family Medicine

## 2016-06-06 NOTE — Progress Notes (Signed)
Spoke with pt and notified of results per Dr. Hinton RaoP. Pt verbalized understanding and denied any questions. [place] and states she will go ahead and call her MD there to schedule f/u with cxr..Marland Kitchen

## 2016-06-10 ENCOUNTER — Ambulatory Visit: Payer: Medicare Other | Admitting: Pulmonary Disease

## 2016-06-14 NOTE — Assessment & Plan Note (Signed)
Recent AB exacerbation now improving  She is a former smoker but PFT today do not show any airflow obstruction or restriction. DLCO is slightly decreased .  May have asthmatic component /RAD . Will continue on Symbicort and monitor sx on return   Plan  Patient Instructions  Chest xray today .  Continue on Protonix 40mg  daily  Stop BREO .  Continue on Symbicort 2 puffs Twice daily, rinses after use.  Follow up Dr. Isaiah SergeMannam in 3 months and As needed   Follow up [place] in [place] when you return.

## 2016-06-14 NOTE — Assessment & Plan Note (Signed)
HCAP w/ recent Influenza -clinically improving  Check cxr today for clearance .  No further abx at this time  Swallow precuations/GERD control   Plan  Patient Instructions  Chest xray today .  Continue on Protonix 40mg  daily  Stop BREO .  Continue on Symbicort 2 puffs Twice daily, rinses after use.  Follow up Dr. Isaiah SergeMannam in 3 months and As needed   Follow up with Primary [place] in [place] when you return.

## 2016-07-02 ENCOUNTER — Telehealth: Payer: Self-pay | Admitting: Adult Health

## 2016-07-02 NOTE — Telephone Encounter (Signed)
PM pt seen by TP for follow up visit on 2.19.18: Patient Instructions  Chest xray today .  Continue on Protonix 40mg  daily  Stop BREO .  Continue on Symbicort 2 puffs Twice daily, rinses after use.  Follow up Dr. Isaiah SergeMannam in 3 months and As needed   Follow up with [place] in [place] when you return.    TP asked that pt be called to check to see if she [facility] Called spoke with patient who reported she is doing "wonderful" since last ov - [facility] will be sure to follow up with her provider "as soon as she gets there", have a cxr and have the report faxed to TP.    Offered to schedule appt with PM in May 2018 but pt declined as she stated she will not be in [place] in May  Nothing further needed Will sign off

## 2016-11-15 ENCOUNTER — Other Ambulatory Visit: Payer: Self-pay | Admitting: Family Medicine

## 2016-11-15 DIAGNOSIS — R7303 Prediabetes: Secondary | ICD-10-CM

## 2017-07-21 IMAGING — CT CT ANGIO CHEST
1 of 8 series · 17 of 36 positions shown · IV contrast (Iohexol (Omnipaque 350))
Comparison: Chest radiograph performed 05/08/2016

CLINICAL DATA: Subacute onset of cough, shortness of breath,
vomiting and hemoptysis. Initial encounter.

EXAM:
CT ANGIOGRAPHY CHEST WITH CONTRAST
TECHNIQUE: Multidetector CT imaging of the chest was performed using the
standard protocol during bolus administration of intravenous
contrast. Multiplanar CT image reconstructions and MIPs were
obtained to evaluate the vascular anatomy.
CONTRAST:  100 mL of Isovue 370 IV contrast

[Series 406: thins pacs · axial · 0.76mm/px · z∈[-353,-119]mm · 17 of 264 slices shown]
[im 15/264  lung]
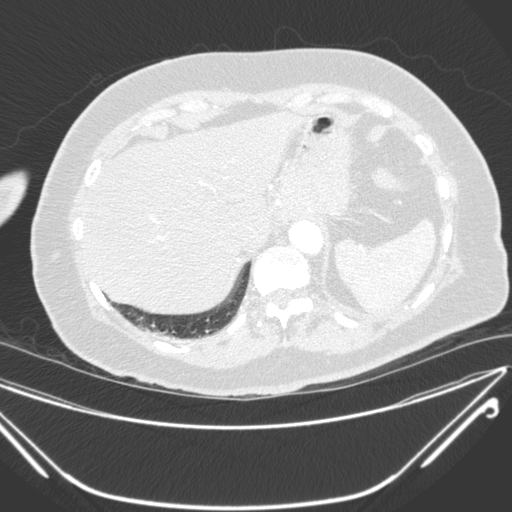
[im 30/264  mediastinal]
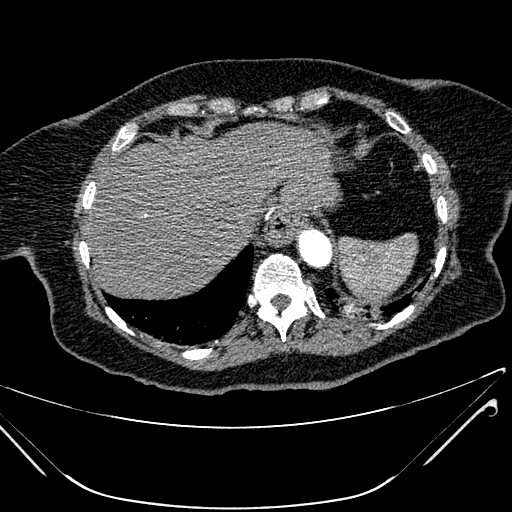
[im 44/264  lung]
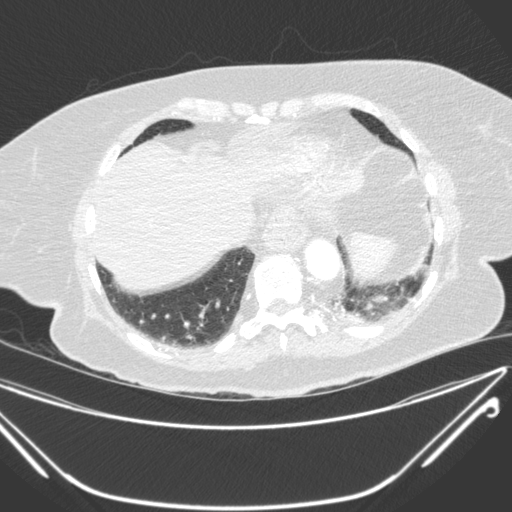
[im 59/264  mediastinal]
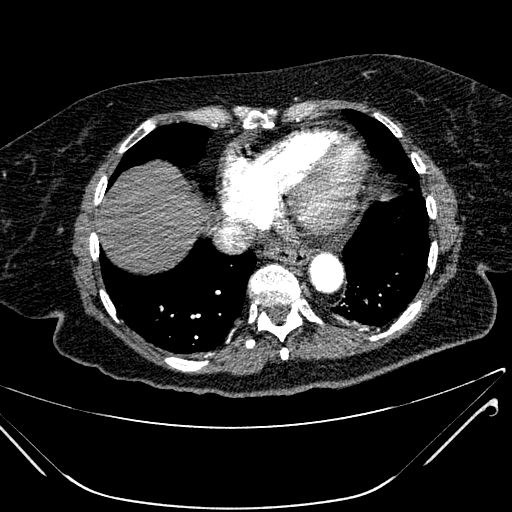
[im 74/264  lung]
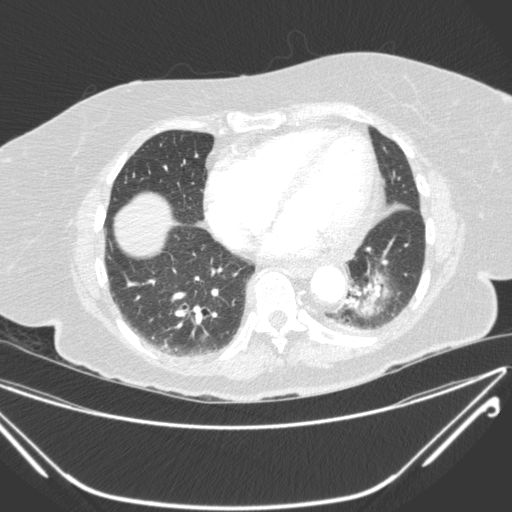
[im 88/264  mediastinal]
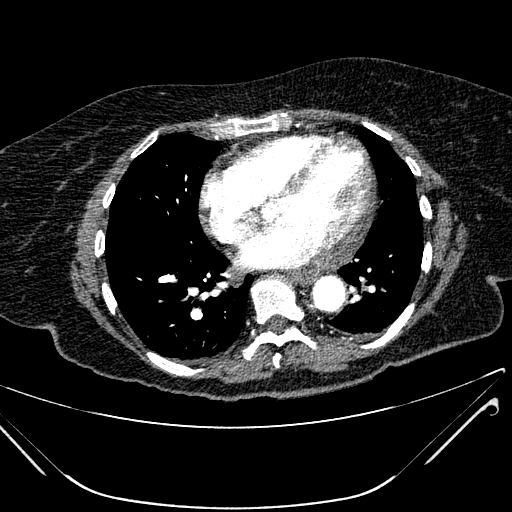
[im 103/264  lung]
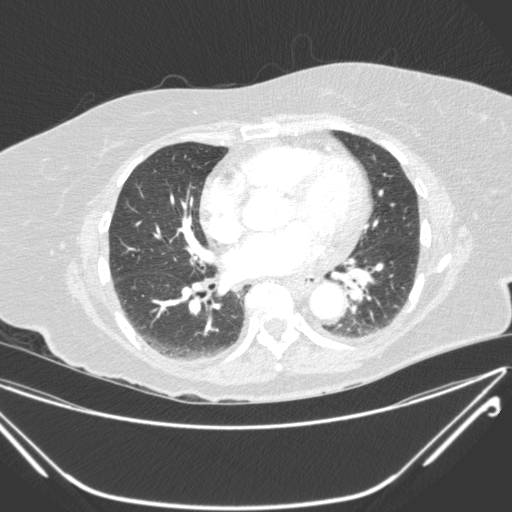
[im 117/264  mediastinal]
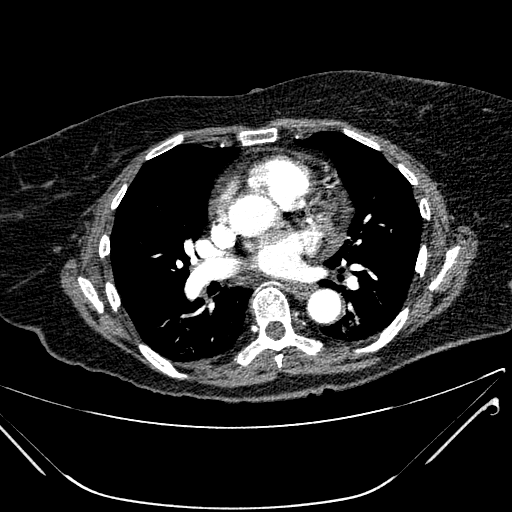
[im 132/264  lung]
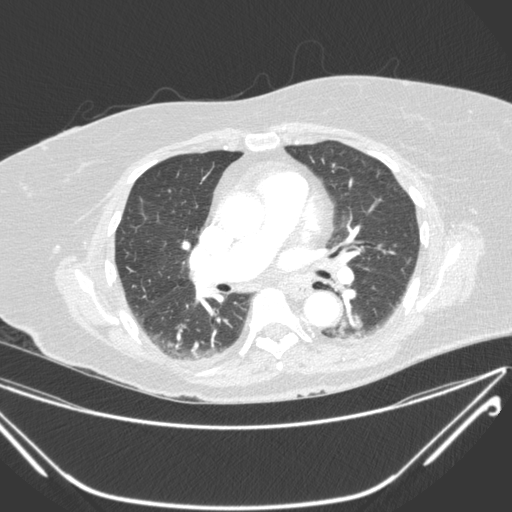
[im 147/264  mediastinal]
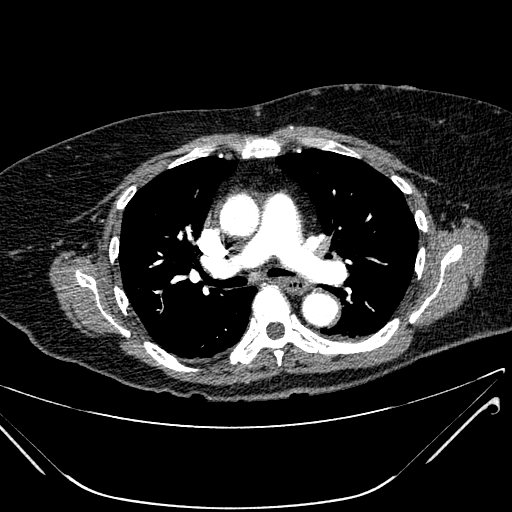
[im 161/264  lung]
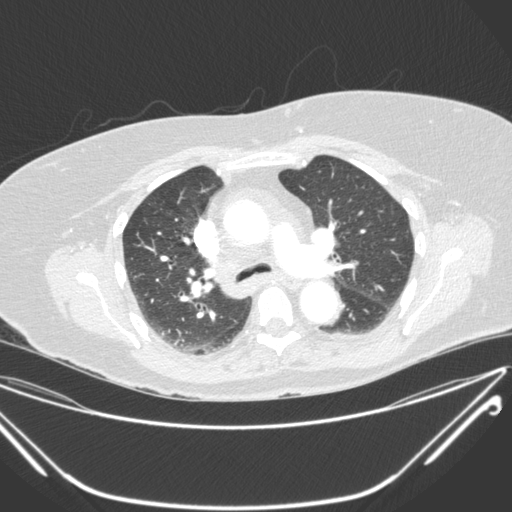
[im 176/264  mediastinal]
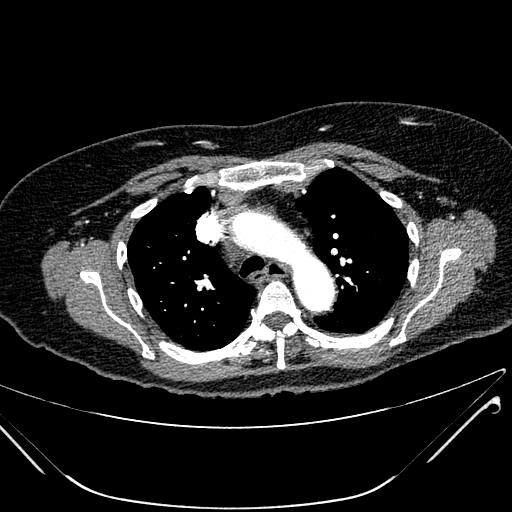
[im 190/264  lung]
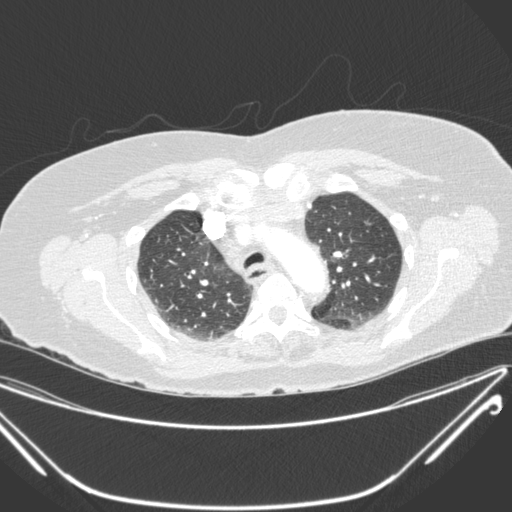
[im 205/264  mediastinal]
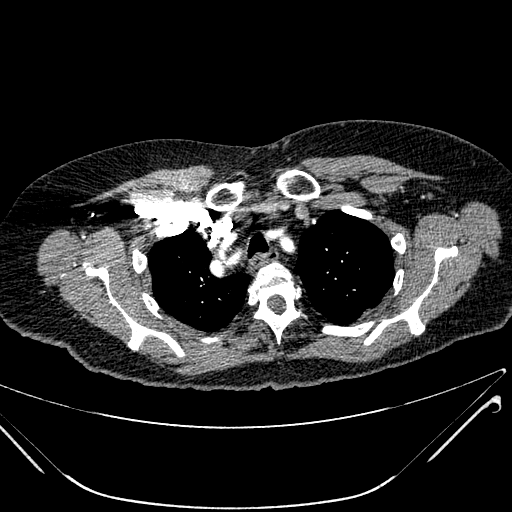
[im 220/264  lung]
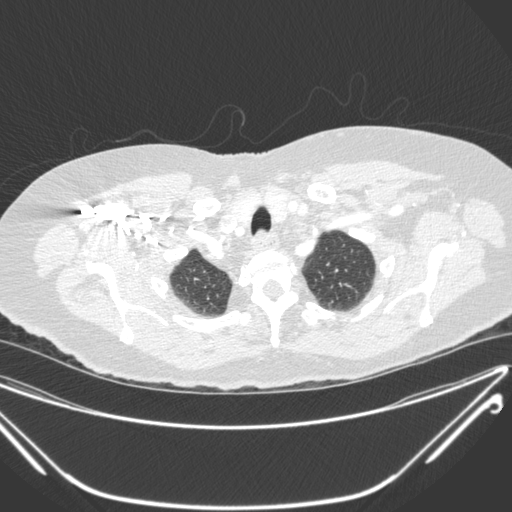
[im 234/264  mediastinal]
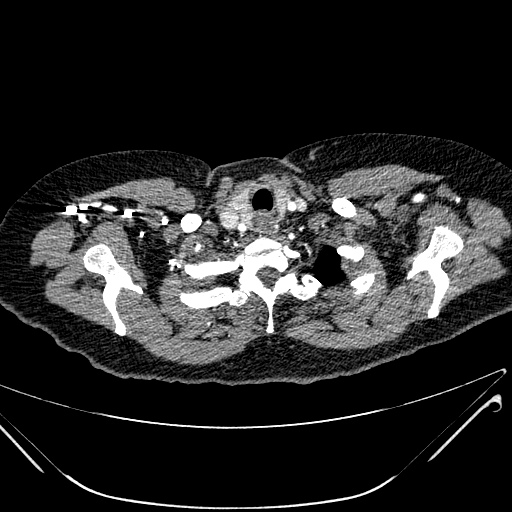
[im 249/264  lung]
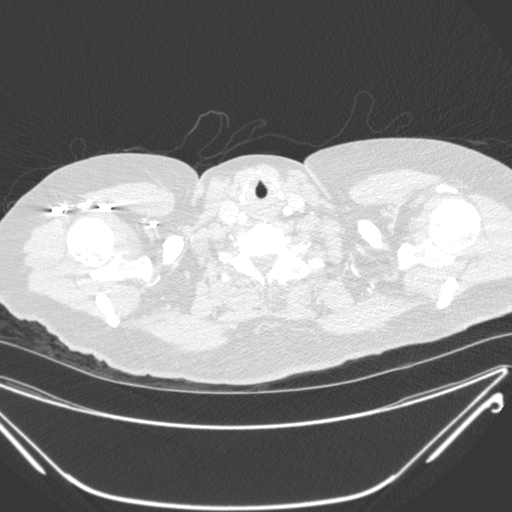

[17 of 36 positions shown; findings below may reference images not displayed]

FINDINGS: Cardiovascular:  There is no evidence of pulmonary embolus.

The heart is borderline normal in size. Minimal calcification is
seen along the aortic arch and descending thoracic aorta. The great
vessels are unremarkable in appearance.

Mediastinum/Nodes: Trace pericardial fluid likely remains within
normal limits. No mediastinal lymphadenopathy is seen. The thyroid
gland is unremarkable. No axillary lymphadenopathy is appreciated.

Lungs/Pleura: Hazy airspace opacification is noted within the left
lower lobe, compatible with pneumonia. Additional minimal opacities
are seen within the right lower lobe and left upper lobe. The lungs
are otherwise grossly clear. No pleural effusion or pneumothorax is
seen. No dominant mass is identified.

Upper Abdomen: The visualized portions of the liver and spleen are
unremarkable. A small hiatal hernia is noted.

Musculoskeletal: No acute osseous abnormalities are identified.
Degenerative change is noted along the lower cervical spine. The
visualized musculature is unremarkable in appearance.

Review of the MIP images confirms the above findings.
IMPRESSION: 1. No evidence of pulmonary embolus.
2. Hazy airspace opacification within the left lower lung lobe,
compatible with pneumonia. Additional minimal airspace opacities
within the right lower lobe and left upper lobe.
3. Small hiatal hernia noted.
